# Patient Record
Sex: Male | Born: 1935 | Race: White | Hispanic: No | Marital: Married | Smoking: Never smoker
Health system: Southern US, Community
[De-identification: ages and names within clinical notes are randomized; demographics above are authoritative.]

## PROBLEM LIST (undated history)

## (undated) DIAGNOSIS — M1990 Unspecified osteoarthritis, unspecified site: Secondary | ICD-10-CM

## (undated) DIAGNOSIS — J449 Chronic obstructive pulmonary disease, unspecified: Secondary | ICD-10-CM

## (undated) DIAGNOSIS — I10 Essential (primary) hypertension: Secondary | ICD-10-CM

## (undated) DIAGNOSIS — I639 Cerebral infarction, unspecified: Secondary | ICD-10-CM

## (undated) HISTORY — PX: REPLACEMENT TOTAL KNEE: SUR1224

## (undated) HISTORY — PX: TOTAL HIP ARTHROPLASTY: SHX124

## (undated) HISTORY — PX: INSERT / REPLACE / REMOVE PACEMAKER: SUR710

---

## 1998-05-28 ENCOUNTER — Ambulatory Visit (HOSPITAL_COMMUNITY): Admission: RE | Admit: 1998-05-28 | Discharge: 1998-05-28 | Payer: Self-pay | Admitting: Orthopedic Surgery

## 1999-01-22 ENCOUNTER — Encounter: Payer: Self-pay | Admitting: Neurosurgery

## 1999-01-22 ENCOUNTER — Ambulatory Visit (HOSPITAL_COMMUNITY): Admission: RE | Admit: 1999-01-22 | Discharge: 1999-01-22 | Payer: Self-pay | Admitting: Neurosurgery

## 1999-03-11 ENCOUNTER — Encounter: Payer: Self-pay | Admitting: Neurosurgery

## 1999-03-11 ENCOUNTER — Observation Stay (HOSPITAL_COMMUNITY): Admission: RE | Admit: 1999-03-11 | Discharge: 1999-03-11 | Payer: Self-pay | Admitting: Neurosurgery

## 1999-04-05 ENCOUNTER — Ambulatory Visit (HOSPITAL_COMMUNITY): Admission: RE | Admit: 1999-04-05 | Discharge: 1999-04-05 | Payer: Self-pay | Admitting: Neurosurgery

## 1999-04-05 ENCOUNTER — Encounter: Payer: Self-pay | Admitting: Neurosurgery

## 1999-06-04 ENCOUNTER — Encounter: Admission: RE | Admit: 1999-06-04 | Discharge: 1999-07-13 | Payer: Self-pay | Admitting: Orthopedic Surgery

## 2000-02-22 ENCOUNTER — Ambulatory Visit (HOSPITAL_COMMUNITY): Admission: RE | Admit: 2000-02-22 | Discharge: 2000-02-22 | Payer: Self-pay | Admitting: Gastroenterology

## 2000-03-09 ENCOUNTER — Ambulatory Visit (HOSPITAL_COMMUNITY): Admission: RE | Admit: 2000-03-09 | Discharge: 2000-03-09 | Payer: Self-pay | Admitting: Orthopedic Surgery

## 2003-06-19 ENCOUNTER — Encounter: Admission: RE | Admit: 2003-06-19 | Discharge: 2003-06-19 | Payer: Self-pay | Admitting: Orthopedic Surgery

## 2004-09-03 ENCOUNTER — Encounter: Admission: RE | Admit: 2004-09-03 | Discharge: 2004-09-03 | Payer: Self-pay | Admitting: Gastroenterology

## 2005-06-13 ENCOUNTER — Encounter: Admission: RE | Admit: 2005-06-13 | Discharge: 2005-06-13 | Payer: Self-pay | Admitting: Orthopedic Surgery

## 2006-02-24 ENCOUNTER — Ambulatory Visit (HOSPITAL_COMMUNITY): Admission: RE | Admit: 2006-02-24 | Discharge: 2006-02-25 | Payer: Self-pay | Admitting: Orthopedic Surgery

## 2006-06-02 ENCOUNTER — Encounter: Admission: RE | Admit: 2006-06-02 | Discharge: 2006-06-02 | Payer: Self-pay | Admitting: Gastroenterology

## 2006-10-11 ENCOUNTER — Inpatient Hospital Stay (HOSPITAL_COMMUNITY): Admission: RE | Admit: 2006-10-11 | Discharge: 2006-10-16 | Payer: Self-pay | Admitting: Orthopedic Surgery

## 2007-05-24 ENCOUNTER — Ambulatory Visit (HOSPITAL_COMMUNITY): Admission: RE | Admit: 2007-05-24 | Discharge: 2007-05-25 | Payer: Self-pay | Admitting: Orthopedic Surgery

## 2008-07-28 ENCOUNTER — Inpatient Hospital Stay (HOSPITAL_COMMUNITY): Admission: EM | Admit: 2008-07-28 | Discharge: 2008-07-29 | Payer: Self-pay | Admitting: Emergency Medicine

## 2008-07-28 ENCOUNTER — Ambulatory Visit: Payer: Self-pay | Admitting: Cardiology

## 2009-10-16 ENCOUNTER — Encounter: Admission: RE | Admit: 2009-10-16 | Discharge: 2009-10-16 | Payer: Self-pay | Admitting: Gastroenterology

## 2010-06-22 LAB — CBC
HCT: 37.8 % — ABNORMAL LOW (ref 39.0–52.0)
MCV: 91.5 fL (ref 78.0–100.0)
WBC: 9.1 10*3/uL (ref 4.0–10.5)

## 2010-06-22 LAB — COMPREHENSIVE METABOLIC PANEL
ALT: 17 U/L (ref 0–53)
AST: 21 U/L (ref 0–37)
Alkaline Phosphatase: 61 U/L (ref 39–117)
BUN: 14 mg/dL (ref 6–23)
CO2: 28 mEq/L (ref 19–32)
Creatinine, Ser: 1.07 mg/dL (ref 0.4–1.5)
GFR calc Af Amer: >60 mL/min (ref >60)
GFR calc non Af Amer: >60 mL/min (ref >60)
Potassium: 4.1 mEq/L (ref 3.5–5.1)
Sodium: 136 mEq/L (ref 135–145)

## 2010-06-22 LAB — PROTIME-INR: INR: 1.1 (ref 0.00–1.49)

## 2010-06-22 LAB — CARDIAC PANEL(CRET KIN+CKTOT+MB+TROPI)
CK, MB: 1.1 ng/mL (ref 0.3–4.0)
CK, MB: 1.3 ng/mL (ref 0.3–4.0)
Relative Index: INVALID (ref 0.0–2.5)
Relative Index: INVALID (ref 0.0–2.5)
Total CK: 49 U/L (ref 7–232)
Total CK: 51 U/L (ref 7–232)
Total CK: 64 U/L (ref 7–232)

## 2010-06-22 LAB — CK TOTAL AND CKMB (NOT AT ARMC)
Relative Index: INVALID (ref 0.0–2.5)
Total CK: 66 U/L (ref 7–232)

## 2010-06-22 LAB — POCT I-STAT, CHEM 8
Glucose, Bld: 110 mg/dL — ABNORMAL HIGH (ref 70–99)
Potassium: 3.9 mEq/L (ref 3.5–5.1)
Sodium: 141 mEq/L (ref 135–145)

## 2010-06-22 LAB — HEPARIN LEVEL (UNFRACTIONATED): Heparin Unfractionated: 0.62 IU/mL (ref 0.30–0.70)

## 2010-06-22 LAB — HEMOGLOBIN A1C: Hgb A1c MFr Bld: 5.6 % (ref 4.6–6.1)

## 2010-07-27 NOTE — Cardiovascular Report (Signed)
NAME:  KHOLE, BRANCH NO.:  [id number]   MEDICAL RECORD NO.:  [id number]          PATIENT TYPE:  INP   LOCATION:  2928                         FACILITY:  [facility]   PHYSICIAN:  Rollene Rotunda, MD, FACCDATE OF BIRTH:  09-01-1934   DATE OF PROCEDURE:  DATE OF DISCHARGE:                            CARDIAC CATHETERIZATION   PRIMARY CARE PHYSICIAN:  Isabelle Course.   PROCEDURE:  Left heart catheterization/coronary angiography.   INDICATIONS:  To evaluate the patient with chest pain suggestive of  unstable angina.  He had multiple cardiovascular risk factors.   PROCEDURE NOTE:  Left heart catheterization was performed via the right  femoral artery, the vessel was cannulated using anterior wall puncture.  A #5-French arterial sheath was inserted via the modified Seldinger  technique.  Preformed Judkins and a pigtail catheter were utilized.  The  patient tolerated procedure well and the left the lab in stable  condition.   RESULTS:  Hemodynamics LV 103/16, AO 99/52.   Coronaries:  The left main did not exist over separate ostia.  The LAD  had diffuse luminal irregularities.  There was small with proximal 25%  stenosis.  Circumflex in the proximal AV groove had luminal  irregularities.  There is mid obtuse marginal, which was large with  proximal 25% stenosis.  A second obtuse marginal was small with distal  40% stenosis.  Ramus intermediate was small with ostial 25% stenosis.  The right coronary artery is large and dominant.  There was long ostial  and proximal 30% stenosis.  There was distal diffuse 25-30% stenosis.  The PDA was a large vessel with mid 30% stenosis.   Left ventriculogram.  The left ventriculogram was obtained in the RAO  projection.  The EF was 65% with normal wall motion.   CONCLUSION:  Nonobstructive coronary artery disease.  Normal left  ventricular function.   PLAN:  The patient with primary risk reduction.  He continues to have  discomfort.   He should have a GI evaluation, which could be done as an  outpatient.      Rollene Rotunda, MD, [facility]  Electronically Signed     JH/MEDQ  D:  07/29/2008  T:  07/29/2008  Job:  [id number]   cc:   Isabelle Course

## 2010-07-27 NOTE — H&P (Signed)
NAME:  [name], [name] NO.:  [id number]   MEDICAL RECORD NO.:  [id number]        PATIENT TYPE:  LINP   LOCATION:                               FACILITY:  [facility]   PHYSICIAN:  Georges Lynch. Gioffre, M.D.DATE OF BIRTH:  18-May-1934   DATE OF ADMISSION:  10/11/2006  DATE OF DISCHARGE:                              HISTORY & PHYSICAL   CHIEF COMPLAINT:  Painful left knee.   HISTORY OF PRESENT ILLNESS:  Marcus Mcgee is a 76 year old gentleman here  for his preadmission history and physical.  He has been having problems  with his left knee since a workman's comp injury 18 months ago.  He has  had it evaluated with arthroscopies and other treatments.  He continues  to have pain.  He says it is progressively worsening. He has significant  cartilage damage to the medial and lateral femoral condyles.  The  patient has failed conservative treatment, and at this time, Dr. [name]  and a second opinion.  The next stepped was with a total knee  arthroplasty and the patient would like to proceed.   PAST MEDICAL HISTORY:  1. Severe pain, the left knee.  2. Insomnia.  3. Hypercholesterolemia.  4. Hypertension.  5. GERD.  6. Hiatal hernia.  7. History of bradycardiac episodes with reported normal stress      Doppler and cardiac workup.  8. Hepatitis A history.   DRUG ALLERGIES:  CODEINE.   CURRENT MEDICATIONS:  1. Darvocet one tablet every 4-6 hours p.r.n. pain.  2. Later 100 mg one tablet twice a day.  3. Ambien C R 12.5 mg q.h.s.  4. Enteric-coated aspirin 325 mg a day.  5. Zocor 40 mg a day.  6. Hydrochlorothiazide 12.5 mg a day.  7. Loratadine 10 mg a day.  8. Omeprazole 20 mg a day.  9. Ranitidine 150 mg 2 tablets a day.   REVIEW OF SYSTEMS:  Positive for an RSD-type reaction, postop  superficial pain of the left knee out of portion to the procedure.  History of chronic bronchitis and COPD currently using BiPAP and O2 at  night.  Recent history of significant  bradycardia with normal cardiac workup  with hospitalization through Dr. Vania Rea.  History of hiatal, reflux and hemorrhoids.  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  History of prostate BPH.  History of skin cancer.  Degenerative disk disease of the lumbar spine.  Otherwise, review of  systems categories are negative and unremarkable.   PAST SURGICAL HISTORY:  1. Cervical surgery in 2000.  2. Multiple lumbar surgeries.  3. TURP.  4. Carpal tunnel, bilateral wrists.  5. Right and left knee scopes.   The patient's only problems with anesthesia was during his carpal tunnel  release.  He had difficult time waking up, but other than that, no  complications.   FAMILY MEDICAL HISTORY:  Mother is deceased at the age of 57 due to  heart disease and lung cancer.  Father is deceased at 52 years of age  due to leukemia and heart disease.  He has multiple sisters  and brothers  with diabetes, hypertension and heart disease.   SOCIAL HISTORY:  The patient is married, lives with his wife, worked as  a Naval architect.  No history of smoking or alcohol use.  Has two grown  children, lives in a [place] house.   PHYSICAL EXAMINATION:  VITALS:  Height is 6 feet 2 inches, weight is  212, blood pressure is 122/72, his heart is regular at 78, respirations  12 from he is afebrile.  GENERAL:  This is a healthy-appearing gentleman conscious, alert and  appropriate.  Ambulates with a cane and his right leg, does walk with a  slight left-sided limp.  HEENT: Head is normocephalic.  Pupils equal, round and reactive.  Extraocular movements intact.  Sclerae is not icteric.  Oral buccal  mucosa is pink and moist.  The patient is hard of hearing.  NECK:  Supple.  No palpable lymphadenopathy.  He has a well-healed  anterior cervical incision.  He is able to touch his chin to the chest.  He is able to look up about 45 degrees and rotate right and left 45  degrees with just mild soreness.  CHEST:  Lung  sounds were clear, but distant, and shallow respirations.  No wheezing, rales, rhonchi.  HEART:  Regular rate and rhythm.  Occasional skipped beat.  No murmurs,  rubs or gallops  ABDOMEN:  Soft, nontender.  Bowel sounds present.  No CVA region  tenderness.  EXTREMITIES:  Upper extremities were symmetrically sized and shaped.  He  had good range of motion with shoulders, elbows and wrists.  LOWER EXTREMITIES:  Right and left hip had full extension, flexion up to  130 degrees, 20-30 degrees internal-external rotation with any  discomfort.  Right knee is without any signs of erythema or ecchymosis.  He can fully extend it.  He can flex it back to 120 degrees.  No  instability.  Left knee:  He lacks about 10 degrees of extension.  He  can flex it back to about 80 degrees.  He has pain with this motion.  He  has quite a bit of swelling and superficial soft tissue tenderness  throughout.  The calves are soft and nontender.  His has good motion of  his ankles.  NEUROLOGICAL:  The patient was conscious, alert and appropriate, good  historian.  No gross neurologic defects noted other than the  hypersensitivity around his left knee.  PERIPHERAL VASCULAR:  Carotid pulses were 2+, no bruits, radial pulses  2+, dorsalis pedis pulses were 1+.  He had very trace lower extremity  edema.  BREAST, RECTAL AND GU:  Exams deferred at this time.   IMPRESSION:  1. 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  2. COPD chronic bronchitis with BiPAP use and nasal cannula O2.  3. Recent bradycardic episodes with normal cardiac workup with Dr.      Vania Rea.  4. History of hiatal hernia, reflux disease, hemorrhoids.  5. 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  6. BPH.  7. Degenerative disk disease in the lumbar and cervical region.   PLAN:  The patient has been evaluated by primary care physician, Dr.  Pablo Lawrence at [facility] for his  upcoming surgical  procedure.  The patient was recently has significant cardiac workup  including hospitalization, stress test, Doppler and Holter monitor for  bradycardic issues.  The patient indicates that he has a normal heart  which appears to  be healthy and unexplained and occasional bradycardia.  We will contact Dr. Colin Rhein office and get this information provided  to use and forward it to the hospital prior to surgery.  Other than  that, the patient will undergo all routine labs and tests prior to  having a left total knee arthroplasty by Dr. [name] on October 11, 2006.  I did have a very frank discussion with the patient about the total knee  arthroplasty and the pain issues and heart work related to physical  therapy, and our concerns were the pain he had just after arthroscopy.  The patient is aware of this and would still like to proceed with a left  total knee arthroplasty by Dr. [name].      Jamelle Rushing, P.A.    ______________________________  Georges Lynch Marcus Mcgee, M.D.    RWK/MEDQ  D:  09/28/2006  T:  09/29/2006  Job:  [id number]   cc:   Windy Fast A. Marcus Mcgee, M.D.  Fax: [phone]

## 2010-07-27 NOTE — Discharge Summary (Signed)
NAME:  Marcus Mcgee, Marcus Mcgee NO.:  [id number]   MEDICAL RECORD NO.:  [id number]          PATIENT TYPE:  INP   LOCATION:  2928                         FACILITY:  [facility]   PHYSICIAN:  Rollene Rotunda, MD, FACCDATE OF BIRTH:  1934-11-18   DATE OF ADMISSION:  07/28/2008  DATE OF DISCHARGE:  07/29/2008                               DISCHARGE SUMMARY   PRIMARY CARDIOLOGIST:  None.   PRIMARY CARE PHYSICIAN:  Dr. Isabelle Course (at [facility]).   DISCHARGE DIAGNOSIS:  Noncardiac chest pain.   SECONDARY DIAGNOSES:  1. Nonobstructive coronary artery disease, status post cardiac      catheterization on Jul 29, 2008.  2. Hyperlipidemia.  3. Benign prostatic hypertrophy.  4. Degenerative joint disease.  5. Gastroesophageal reflux disease (with hiatal hernia).  6. Hepatitis A.  7. Chronic obstructive pulmonary disease.  8. Obstructive sleep apnea (on bilevel positive airway pressure).   PAST SURGICAL HISTORY:  1. S/P bilateral knee surgery.  2. S/P back surgery.  3. S/P C-spine surgery.  4. S/P bilateral wrist surgery.  5. S/P right shoulder surgery.  6. S/P total left knee arthroplasty.   ALLERGIES/INTOLERANCES:  Intolerant to CODEINE and VIOXX.   PROCEDURES PERFORMED DURING THIS HOSPITALIZATION:  The patient had EKG  performed on Jul 28, 2008, showing sinus bradycardia at a rate of 55 bpm  with no acute ST-T wave changes and no significant Q-waves; however,  left axis deviation and possible left anterior fascicular block and no  evidence of hypertrophy.  PR 180, QRS 98, and QTc 409.  The patient had  chest x-ray completed on Jul 28, 2008, that showed no acute  cardiopulmonary disease and mild left base scar or atelectasis.  The  patient had cardiac catheterization on Jul 29, 2008, showing  nonobstructive coronary artery disease and normal LV function with LVEF  estimated at 65%.   HISTORY OF PRESENT ILLNESS:  Mr. Gair is a 76 year old male with no  prior cardiac  history.  He did have a workup in 1980 for chest  discomfort, but no etiology was identified.  There was an apparent  stress test at another facility around 2008 by the patient's report.  He  thinks it was normal.  This was done in part because he has a resting  heart rate at times in 30's at night.   Yesterday, he was working, doing remodeling in a house which he has been  doing for a while.  He noticed while going up and down stairs, he was  more dyspneic and actually mentioned this to his wife.  However, he  denies chest discomfort.  Otherwise, he has been able to do his regular  walking and activities without bringing on any symptoms.  Last night  (Jul 27, 2008), he was trying to sleep and noticed left-sided chest  discomfort.  It was a 6/10 at its peak and did not radiate.  He denies  any previous symptoms similar to this.  He felt like he could not take a  deep breath at that time.  However, he denies worsening with position or  deep inhalation.  Pain waxed and waned slightly in intensity throughout  the evening.  The pain is reported as sharp in quality.  He presented to  the emergency department where he had no acute ST-T wave changes.  However, he continued to report chest discomfort.  So, at the time of  his H&P, no improvement with sublingual nitroglycerin.  He also was  given aspirin and then IV nitroglycerin.   HOSPITAL COURSE:  The patient admitted and underwent procedures as  described above.  He tolerated them well without any significant  complications.  The patient's cardiac enzymes remained negative during  his hospital course; however, due to his advanced age and compound  symptomology, he underwent cardiac catheterization (see results above).  Vital signs remained stable during the patient's brief hospital course.  Most recent vital signs on date of discharge were temperature 98 degrees  Fahrenheit, BP 103/57, pulse 52, respiration rate 18, O2 saturation 99%  on  room air.  Just prior to discharge, the patient received consultation  by care coordinator and case management consult placed for home O2.  Otherwise, the patient will follow up with primary care Myrissa Chipley who  will determine whether or not a GI consult is required.  No further  cardiac workup is advised at this time.  The patient will use his old  medication list with no changes, followup instructions, and post cath  instructions at the time of discharge.  All questions and concerns will  be addressed at that time.   DISCHARGE MEDICATIONS:  1. Trazodone 50 mg p.o. at bedtime.  2. Temazepam 15 mg p.o. at bedtime.  3. Flomax 0.4 mg p.o. daily.  4. HCTZ/lisinopril 12.5/20 mg p.o. daily.  5. Lyrica 150 mg p.o. b.i.d.  6. Omeprazole 20 mg p.o. b.i.d.  7. Zocor 10 mg p.o. daily.  8. Gemfibrozil 600 mg p.o. daily.  9. Loratadine 10 mg p.o. daily.  10.Enteric-coated aspirin 81 mg p.o. daily.  11.Vitamin B complex.  12.The vitamin D.   Duration of discharge encounter is 45 minutes including physician time.       Jarrett Ables, [facility]      Rollene Rotunda, MD, [facility]  Electronically Signed    MS/MEDQ  D:  07/29/2008  T:  07/30/2008  Job:  [id number]   cc:   Isabelle Course

## 2010-07-27 NOTE — Op Note (Signed)
NAME:  Marcus Mcgee, Marcus Mcgee NO.:  [id number]   MEDICAL RECORD NO.:  [id number]          PATIENT TYPE:  AMB   LOCATION:  SDS                          FACILITY:  [facility]   PHYSICIAN:  Vania Rea. Supple, M.D.  DATE OF BIRTH:  1935/01/06   DATE OF PROCEDURE:  05/24/2007  DATE OF DISCHARGE:                               OPERATIVE REPORT   PREOPERATIVE DIAGNOSIS:  1. Chronic right shoulder impingement syndrome.  2. Right shoulder symptomatic acromioclavicular joint arthrosis.   POSTOPERATIVE DIAGNOSIS:  1. Chronic right shoulder impingement syndrome.  2. Right shoulder symptomatic acromioclavicular joint arthrosis.  3. Diffuse chondromalacia of the glenoid.  4. Complex and extensive circumferential degenerative labral tear.  5. Multiple intra-articular chondral loose bodies.   PROCEDURE:  1. Right shoulder examination under anesthesia.  2. Right shoulder diagnostic arthroscopy.  3. Debridement of complex and extensive degenerative circumferential      labral tear.  4. Chondroplasty of the glenoid.  5. Removal of multiple chondral loose bodies.  6. Arthroscopic subacromial decompression bursectomy.  7. Arthroscopic distal clavicle resection.   SURGEON:  Vania Rea. Supple, M.D.   Threasa HeadsFrench Ana A. Shuford, P.A.-C.   ANESTHESIA:  General endotracheal as well as a preop interscalene block.   ESTIMATED BLOOD LOSS:  Minimal.   DRAINS:  None.   HISTORY:  Marcus Mcgee is a 76 year old gentleman who has had chronic right  shoulder pain with progressively increasing functional limitations and  symptoms that have been refractory to prolonged attempts at conservative  management.  Due to his ongoing pain and functional limitations, he is  brought to the operating room at this time for planned right shoulder  arthroscopy as described below.  I preoperatively counseled Marcus Mcgee on  treatment options as well as risks versus benefits thereof.  The  possible surgical complications of  bleeding, infection, neurovascular  injury, persistent pain, loss of motion, anesthetic complications,  possible need for additional surgery are reviewed.  He understands,  accepts, and agrees with our planned procedure.   PROCEDURE IN DETAIL:  After undergoing routine preop evaluation, the  patient received prophylactic antibiotics.  Interscalene block was  established in the preop holding area by the anesthesia department.  He  was placed supine on the table and underwent smooth induction of a  general endotracheal anesthesia.  He was turned to the left lateral  decubitus position on the beanbag and appropriately padded and  protected.  Right shoulder examination under anesthesia revealed full  passive motion.  There were no obvious instability patterns.  The right  arm suspended at 70 degrees of abduction with 10 pounds of traction.  The right shoulder girdle region was then sterilely prepped and draped  in a standard fashion.   A posterior portal was established in the glenohumeral joint and  anterior portal established under direct visualization.  There was an  obvious extensive degenerative tear of the labrum anteriorly,  superiorly, posteriorly and inferiorly.  All aspects of the labrum were  debrided to a stable base with a shaver.  There was also diffuse grade 2  and 3 chondromalacia across the glenoid face and these areas were all  debrided with the shaver into a stable cartilaginous base.  The humeral  head appeared to be in good condition.  The rotator cuff, while showing  some evidence for diffuse degeneration, there was no obvious tearing.  There were no obvious instability patterns.  There were a number of  cartilaginous loose bodies that were all evacuated with the shaver.  At  this point, final inspection and irrigation was then completed.  Fluid  and instruments were removed from the glenohumeral joint.  The arm was  dropped down to about 30 degrees of abduction with  the arthroscope  introduced in the subacromial space through a posterior portal and a  direct lateral portal established in the subacromial space.  Abundant  bursal tissue was encountered and this was debrided with a combination  of the shaver and the Arthrex wand.  The wand was used to remove  periosteum from the under surface of the anterior half of the acromion.  There was an anterior inferior acromial hook.  A bur was introduced  and used to perform subacromial decompression creating a type 1  morphology.  The bursal surface of the rotator cuff was carefully  inspected and probed and found to be intact although there was some  generalized fraying and thinning.  There were certainly no full  thickness defects.  There was no significant attenuation to suggest any  need for rotator cuff repair.  There was a very prominent spur emanating  from the anterior aspect of the distal clavicle and marked degeneration  of the capsular tissues at the [facility] joint.  A portal was then established  anterior to the distal clavicle and distal clavicle resection performed  with a bur.  Care was taken to assure the entire circumference of the  distal clavicle could be visualized to ensure adequate removal of bone.  We then completed a subacromial/subdeltoid bursectomy.  Approximately 8  mm of bone was resected from the distal clavicle.  At this time, final  hemostasis was obtained.  Fluid and instruments were removed.  The  portals were closed with Monocryl and Steri-Strips.  A bulky dry  dressing was taped to the right shoulder and the right arm was placed a  sling.  The patient was placed supine, extubated, and taken to the  recovery room in stable condition.      Vania Rea. Supple, M.D.  Electronically Signed     KMS/MEDQ  D:  05/24/2007  T:  05/24/2007  Job:  [id number]

## 2010-07-27 NOTE — H&P (Signed)
NAME:  Marcus Mcgee, Marcus Mcgee NO.:  [id number]   MEDICAL RECORD NO.:  [id number]          PATIENT TYPE:  INP   LOCATION:  1823                         FACILITY:  [facility]   PHYSICIAN:  Rollene Rotunda, MD, FACCDATE OF BIRTH:  08/14/1934   DATE OF ADMISSION:  07/28/2008  DATE OF DISCHARGE:                              HISTORY & PHYSICAL   PRIMARY CARE PHYSICIAN:  Dr. Pablo Lawrence, [facility].   CARDIOLOGIST:  None.   REASON FOR PRESENTATION:  Evaluate patient with chest pain.   HISTORY OF PRESENT ILLNESS:  The patient is a pleasant 76 year old  gentleman who has no prior cardiac history.  He did have a workup in  1980 for chest discomfort, but no etiology was identified.  There was  apparent stress test at another facility around 2008 by the patient's  report.  He thinks that that was normal.  This was done in part because  he has a resting heart rate at times in the 30s at night.   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  He did notice it going up and down the stairs.  He  was more dyspneic, and actually mentioned this to his wife.  However, he  did not have chest discomfort.  He has, otherwise, been able to do  walking and activity without bringing on any of these symptoms.  Last  night he was trying to sleep.  He had discomfort left-sided under his  left breast that woke him.  It was 6/10 at its peak.  It did not radiate  to his arms or to his neck.  He did not remember having this in the  past.  He did not take anything to try to improve it.  He felt like he  could not get a deep breath.  Did not, however, get worse with deep  breathing or with position.  It would wax and wane slightly in  intensity.  It was a sharper pain rather than a dull pain.  He finally  presented to the emergency room where he has had no acute ST-segment  changes.  However, he continues to have this discomfort.  So far there  has been no  improvement with sublingual nitroglycerin.  He has also had  aspirin.  He is now being started on IV nitroglycerin.  First point of  care markers were negative.   PAST MEDICAL HISTORY:  1. Hyperlipidemia.  2. Benign prostatic hypertrophy.  3. Degenerative joint disease.  4. Gastroesophageal reflux disease/hiatal hernia.  5. Hepatitis A.  6. COPD/obstructive sleep apnea (on BiPAP).   PAST SURGICAL HISTORY:  1. Bilateral knee surgery.  2. Back surgery.  3. C-spine surgery.  4. Bilateral wrist surgery.  5. Right shoulder surgery.  6. Left total knee replacement.   ALLERGIES:  Intolerance to CODEINE.   MEDICATIONS:  1. Trazodone.  2. Temazepam.  3. Flomax.  4. Lisinopril HCT 20/12.5.  5. Lyrica 150 b.i.d.  6. Omeprazole 20 mg b.i.d.  7. Zocor 10 mg daily.  8. Lopid 600 mg  daily.   SOCIAL HISTORY:  The patient lives in [place] with his wife.  He is a  [employer]al [employer].  He has never smoked cigarettes.  He does not drink  alcohol.   FAMILY HISTORY:  Remarkable for his father having his first myocardial  infarction in his 34s.  He died at 14 of complications of coronary  disease.  He had a brother who had a massive myocardial infarction at  53.   REVIEW OF SYSTEMS:  As stated in the HPI, and, otherwise, positive for  decreased hearing, arthralgias, insomnia, knee pain.  Negative for all  other systems.   PHYSICAL EXAMINATION:  The patient is pleasant and in no distress.  Blood pressure 110/55, heart rate 55 and regular, respiratory rate 16,  afebrile.  HEENT:  Eyelids unremarkable, pupils equal, round, and reactive to  light, fundi not visualized, oral mucosa unremarkable.  NECK:  No jugular distention at 45 degrees, carotid upstrokes brisk and  symmetric, no bruits, no thyromegaly.  LYMPHATICS:  No cervical, axillary or inguinal adenopathy.  LUNGS:  Clear to auscultation bilaterally.  BACK:  No costovertebral angle tenderness.  CHEST:  Unremarkable.  HEART:  PMI not  displaced or sustained, S1 and S2 within normal limits.  No S3, no S4, no clicks, no rubs, no murmurs.  ABDOMEN:  Mildly obese, positive bowel sounds normal in frequency and  pitch.  No bruits, no rebound, no guarding, no midline pulsatile mass.  No hepatomegaly, no splenomegaly.  SKIN:  No rashes, no nodules.  EXTREMITIES:  2+ pulses throughout.  No edema, no cyanosis, no clubbing.  NEUROLOGICAL:  Oriented to person, place, time.  Cranial nerves II-XII  grossly intact, motor grossly intact.   EKG:  Sinus rhythm, rate at 52, left axis deviation, left anterior  fascicular block, nonspecific inferior T-wave flattening, possible old  anteroseptal infarct.   LABORATORIES:  WBC 13.9, hemoglobin 41.  Sodium 141, potassium 3.9, BUN  18, creatinine 1.2.  Chest x-ray:  No acute disease, mild left basilar  scar.   ASSESSMENT AND PLAN:  1. Chest pain.  The patient's chest pain is worrisome for unstable      angina.  He has had no improvement with sublingual nitroglycerin.      He has a strong family history of obstructive coronary disease.  He      does have borderline hypertension and does have dyslipidemia.  He      was complaining of dyspnea yesterday with exertion.  Given this, I      think the pretest probability of obstructive coronary disease is      somewhat high.  Therefore, I would not suggest stress perfusion      imaging, but would rather suggest cardiac catheterization.  I      discussed this risk at length with the patient and his wife and      family.  They understand the procedure, and would agree to proceed.      For now, we are going to use aspirin and heparin.  Of course, we      cannot use a beta blocker with his bradycardia.  We will use      nitroglycerin for pain relief.  We will use other medications as      needed such as morphine.  He will be taken electively to the      catheterization lab, but urgently if he has any increasing      discomfort or objective signs of  ischemia.  2. Bradycardia.  This seems to be at baseline.  He has not had any      presyncope or syncope.  This can be observed avoiding all negative      chronootropes.  3. Risk reduction.  Will check a lipid profile.  4. Hypertension.  He says this is borderline.  We may have to hold his      lisinopril HCT while we are using nitrates.  5. Joint pain.  He will continue his previous medical regimen.      Rollene Rotunda, MD, [facility]  Electronically Signed     JH/MEDQ  D:  07/28/2008  T:  07/28/2008  Job:  [id number]

## 2010-07-27 NOTE — Op Note (Signed)
NAME:  Marcus Mcgee, ZAHLER NO.:  [id number]   MEDICAL RECORD NO.:  [id number]          PATIENT TYPE:  INP   LOCATION:  0010                         FACILITY:  [facility]   PHYSICIAN:  Georges Lynch. Gioffre, M.D.DATE OF BIRTH:  [birth year]   DATE OF PROCEDURE:  10/11/2006  DATE OF DISCHARGE:                               OPERATIVE REPORT   SURGEON:  Dr. Darrelyn Hillock.   ASSISTANT:  Arlyn Leak, PA.   PREOP DIAGNOSIS:  Severe traumatic degenerative type, arthritis left  knee.   POSTOPERATIVE DIAGNOSIS:  Severe traumatic degenerative type, arthritis  left knee.   OPERATION:  Left total knee arthroplasty utilizing the DePuy system.  I  utilized a rotating platform, size 5 10 mm thickness insert.  The tibial  tray was a size 5, the femur was a size 5 left posterior cruciate  sacrificing type femoral component.   PROCEDURE:  Under general anesthesia routine orthopedic prep and drape  of the left lower extremity was carried out.  The leg was exsanguinated  with an Esmarch.  Tourniquet was elevated at 350 mmHg.  Total tourniquet  time was about 1 hour.  At this time an incision was made over the  anterior aspect of the left knee.  Bleeders were identified and  cauterized.  I then created a median parapatellar incision. The patella  was reflected laterally.  The knee was flexed and I then did medial and  lateral meniscectomies and excised the anterior and posterior cruciate  ligaments.  Of note he had significant arthritic changes in his knee.   We made an initial drill hole in the intercondylar notch.  A guide rod  was inserted up into the femoral canal and then we removed 10 mm  thickness off the distal femur.  The #2 jig was inserted to do our  appropriate measurements and we elected to use a size 5 left femur.  We  then inserted our #3 jig and carried out anterior and posterior chamfer  cuts for the distal femur.  Following that we went down and prepared the  tibia for a size 5  tibia.  We removed 4 mm thickness off the affected  medial side of the tibia.  We measured the tibia to be a size 5 tray.   Following that we cut our keel cut out of the tibial plateau.  After the  tibia was prepared we then searched for posterior osteophytes, cleaned  out the knee posteriorly as well.  We then cut our notch cut out of the  distal femur in the usual fashion and then following that, inserted our  trial components went through range of motion and selected a 10-mm  thickness tibial insert.  We then cut our patella.  We did a resurfacing  patella in the usual fashion.  The appropriate measurements for the  patella were taken.  We then made three drill holes in the patella for a  size 41 patellar component.   Following that we then removed all three components, water picked out  the knee, inserted Gelfoam in the usual fashion in  the femoral canal and  the tibial canal.  We then made sure the bone ends were dried and then  we inserted our cement and cemented all three components in  simultaneously.  We then went through and examined the knee and made  sure there were no other loose pieces of cement.  We then injected 20 mL  of 0.25% Marcaine with epinephrine and Toradol in the soft tissue.  We  then inserted FloSeal 10 mL, let the tourniquet down, held pressure on  the leg for a few minutes and then flexed the knee up and made sure the  tibial side was clean and we then  inserted our permanent size 5 10 mm thickness rotating platform, reduced  the knee and had excellent function.  We then reduced the knee and then  inserted a Hemovac drain.  I closed the knee in layers in the usual  fashion.  Sterile Neosporin dressing was applied.  The patient had 2  grams of IV Ancef preop.           ______________________________  Georges Lynch. Darrelyn Hillock, M.D.     RAG/MEDQ  D:  10/11/2006  T:  10/12/2006  Job:  [id number]   cc:   Windy Fast A. Darrelyn Hillock, M.D.  Fax: [phone]

## 2010-07-30 NOTE — Op Note (Signed)
[facility]  Patient:    Marcus Mcgee, Marcus Mcgee                            MRN: [id number] Proc. Date: 03/09/00 Attending:  Fayrene Fearing P. Aplington, M.D.                           Operative Report  PREOPERATIVE DIAGNOSIS:  Torn medial meniscus, right knee.  POSTOPERATIVE DIAGNOSES: 1. Minor tears medial and lateral menisci. 2. 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  OPERATION PERFORMED:  Right knee arthroscopy with 1) shaving of medial and lateral menisci, 2) shaving of medial and lateral femoral condyle.  SURGEON:  Dr. Simonne Come.  ASSISTANT:  Nurse.  ANESTHESIA:  General.  PATHOLOGY AND JUSTIFICATION FOR PROCEDURE:  He has had 2 prior right knee surgeries, the first being and arthrotomy in 1996 with debridement of the knee and arthroscopy in the early 1980s. Recently he has had progressive pain in the inner aspect of the knee associated with giving way causing him to fall and feeling that it wants to catch. MRI demonstrate a suspected medial meniscus tear. For this reason, he is here today for the above mentioned arthroscopy. See operative description below for additional details and pathology.  DESCRIPTION OF PROCEDURE:  Satisfactory general anesthesia, pneumatic tourniquet, thigh stabilizer, the right knee was prepped with Duraprep, draped in a sterile field. Superior medial saline inflow pushed through an anterior medial portal. The lateral compartment of the knee joint was evaluated. He had a moderate amount of fibrous tissue and synovium which I had to resect for visualization purposes. There was a little flap deformity of the anterior third of the lateral meniscus which I shaved down until. The inner border and the medial third also was irregular and I shaved this down until smooth as well. There was a defect noted in the mid portion of the weightbearing area of the lateral femoral condyle which I debrided down gently  with a 3.5 shaver. The remainder of the knee joint laterally looked quite good. The ACL looked intact. Looking up in the lateral gutter and suprapatellar area, no abnormalities were noted. I then reversed portals. Had a moderate amount of synovitis and fibrosis there as well which I resected. He had deformity of the anterior third of the medial meniscus which I shaved down until smooth. He also had a defect on the weightbearing surface of the medial femoral condyle ______ to the intercondylar area which I shaved down until smooth as well. The posterior horn was well visualized and had no deformity noted. On looking up in the medial gutter and suprapatellar area, no other additional abnormalities were noted. The knee joint was then irrigated until clear and all fluid possible removed. The 2 anterior portals were closed with 4-0 nylon, 20 cc of 0.5% Marcaine with adrenaline and 4 mg of morphine were then instilled through the inflap rasp which was removed and this portal closed with 4-0 nylon as well. Betadine Adaptic dry sterile dressing were applied. The tourniquet was released. The patient tolerated the procedure well and at the time of this dictation was on his way to the recovery room in satisfactory condition with no known complications. DD:  03/09/00 TD:  03/09/00 Job: [id number]

## 2010-07-30 NOTE — Discharge Summary (Signed)
NAMEMarland Kitchen  Mcgee, Marcus                  ACCOUNT NO.:  [id number]   MEDICAL RECORD NO.:  [id number]          PATIENT TYPE:  INP   LOCATION:  1607                         [facility]ACILITY:  [facility]   PHYSICIAN:  Georges Lynch. Gioffre, M.D.DATE OF BIRTH:  [birth year]   DATE OF ADMISSION:  10/11/2006  DATE OF DISCHARGE:  10/16/2006                               DISCHARGE SUMMARY   ADMISSION DIAGNOSES:  1. 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  2. History of chronic obstructive pulmonary disease with the use of      biphasic positive airway pressure machine.  3. Bradycardic episodes with normal cardiac workup.  4. History of hiatal hernia.  5. Reflux disease.  6. History of hemorrhoids.  7. History of hepatitis A.  8. Benign prostatic hypertrophy.  9. Degenerative disk disease in the lumbar cervical region.   DISCHARGE DIAGNOSES:  1. Left total knee arthroplasty.  2. Asymptomatic postoperative blood loss anemia.  3. Postoperative temperatures, etiology unknown.  4. History of chronic obstructive pulmonary disease with biphasic      positive airway pressure machine use with postoperative small      pleural effusion.  5. History of bradycardic events with normal cardiac workup.  6. History of hiatal hernia.  7. History of reflux disease.  8. History of hemorrhoids.  9. History of hepatitis A.  10.History of benign prostatic hypertrophy.  11.History of cervical and lumbar degenerative disk disease.   HISTORY OF PRESENT ILLNESS:  The patient is a 76 year old gentleman with  a Worker's Compensation injury to his left knee.  He had small areas of  traumatic arthritis and continued to have severe pain with failed  conservative treatment.  The patient elects to go ahead with a total  knee arthroplasty.   ALLERGIES:  CODEINE.   MEDICATIONS ON ADMISSION:  1. Darvocet 1 tablet every 4-6 hours.  2. Ambien CR 12.5 mg nightly.  3. Enteric-coated aspirin.  4. Zocor 40  mg a day.  5. Hydrochlorothiazide 12.5 mg a day.  6. Loratadine 10 mg a day.  7. Omeprazole 20 mg a day.  8. Ranitidine 150 mg 2 tablets a day.   PROCEDURE:  On October 11, 2006, the patient underwent a left total knee  arthroplasty by Dr. Darrelyn Hillock, assisted by Oneida Alar, PA-C, under general  anesthesia.  There were no complications.  Minimal blood loss.  The  patient was transferred to the recovery room and then to orthopedic  floor in good condition.  The patient had the following components  implanted:  A size 5 left femoral component, a size 5 cemented keel  tibial tray, a size 5-10 mm polyethylene bearing, size 41 mm, three-peg  patella.  All components were implanted with polymethyl methacrylate and  vancomycin mixed in.   CONSULTATIONS:  The following routine consults were requested:  physical  therapy, case management, pharmacy for Coumadin dosing.   HOSPITAL COURSE:  On October 11, 2006, the patient was taken to the OR by  Dr. Ranee Gosselin.  The patient had a left total knee  arthroplasty  performed without any complications.  The patient was transferred to the  recovery room and then to the orthopedic floor in good condition.  The  patient was on IV antibiotics, pain medicines and Coumadin and heparin  for DVT prophylaxis.  The patient then underwent a 4-day postoperative  course in which the patient did develop some postoperative temperatures.  It was evaluated with chest x-ray.  No signs of a pneumonia, but he did  have a small pleural effusion on the left.  The patient had a history of  COPD.  The patient was encouraged to use incentive spirometer, deep  breathing and coughing.  The patient's wound had a significant amount of  bruising.  Subcutaneously within the joint, he developed some  significant pressure blisters throughout the left knee which has  probably contributed to his postoperative temperatures.  He was placed  on Keflex prophylactically due to the blisters.  The  patient used BiPAP  machine during this hospitalization.  The patient worked with physical  therapy on a daily basis without any issues.  He was able to transition  from IV pain medicines and to p.o. medications while without any issues.  It was felt on postop day #4, he was orthopedically ready for discharge  home to outpatient home health physical therapy.  Arrangements were made  and he was discharged in good condition.   LABORATORY DATA AND X-RAY FINDINGS:  CBC on admission found WBC 7.2,  hemoglobin 14.6, platelets 269.  On August 2, his white count went up to  14.  On discharge, his CBC was 11.1, hemoglobin 10, hematocrit 29,  platelets 172.  He was on Keflex for significant pressure blisters  around the incision.  He had no focal signs of infection at the knee or  pneumonia.  The patient's INR was 2.4 with pharmacy adjusting his  Coumadin.  Routine chemistries on August 1, found sodium 135, potassium  of 3.5, glucose 153, BUN 10, creatinine 0.90 with no adjustments of  medications.  EKG on admission showed marked sinus bradycardia at 39  beats per minute.  Chest x-ray on August 2, found new left pleural  effusion.  No evidence of pneumonia.   DIET:  No restrictions.   ACTIVITY:  The patient is to increase activity as tolerated.  Weightbearing as tolerated with the use of crutches.   WOUND CARE:  The patient is to change his dressing daily and keep wound  clean.   FOLLOW UP:  The patient needs a followup appointment with Dr. Darrelyn Hillock in  1 week from discharge, [phone], for that appointment.   DISCHARGE MEDICATIONS:  1. Mepergan fortis 1-2 tablets every 4-6 hours for pain, if needed.  2. Robaxin 500 mg 1 tablet every 6 hours for pain as needed.  3. Coumadin 5 mg once a day unless changed by home health pharmacy.  4. Keflex 500 mg p.o. four times a day.  5. Ambien CR 12.5 mg p.o. nightly.  6. Zocor 40 mg a day.  7. Hydrochlorothiazide 12.5 mg a day.  8. Loratadine 10 mg a  day.  9. Omeprazole 20 mg a day.  10.Ranitidine 150 mg 2 tablets a day.   CONDITION ON DISCHARGE:  The patient's condition upon discharge to home  is listed as improved and good.      Jamelle Rushing, P.A.    ______________________________  Georges Lynch Darrelyn Hillock, M.D.    RWK/MEDQ  D:  10/25/2006  T:  10/26/2006  Job:  [id number]

## 2010-07-30 NOTE — Procedures (Signed)
[facility]  Patient:    Marcus Mcgee, Marcus Mcgee                         MRN: [id number] Adm. Date:  [id number] Attending:  Orland Mustard CC:         Anderson Malta, M.D., [address] [place], [place]  [address]   Procedure Report  DATE OF BIRTH:  December 24, 1934.  PROCEDURE:  Colonoscopy.  ENDOSCOPIST:  Llana Aliment. Edwards, M.D.  MEDICATIONS:  Fentanyl 62.5 mcg, Versed 7 mg IV.  SCOPE:  Pediatric videocolonoscope.  INDICATION:  New onset of rectal bleeding in a 76 year old gentleman.  DESCRIPTION OF PROCEDURE:  Procedure had been explained to the patient and consent obtained.  With the patient in the left lateral decubitus position, the pediatric videocolonoscope was inserted and advanced under direct visualization.  Prep was excellent.  We were able to advance to the cecum without difficulty.  Ileocecal valve and appendiceal orifice were identified. Scope was withdrawn; cecum, ascending colon, hepatic flexure, transverse colon, splenic flexure, descending and sigmoid colon seen well upon removal. Moderate diverticulosis was seen in the sigmoid colon and internal hemorrhoids seen in the rectum.  Scope was withdrawn.  Patient tolerated procedure well.  ASSESSMENT 1. Internal hemorrhoids -- probable source of bleeding. 2. Moderate diverticulosis.  PLAN 1. Will plan on giving a hemorrhoid sheet and diverticular sheet. 2. I will see back on an as-needed basis. DD:  02/22/00 TD:  02/22/00 Job: [id number]

## 2010-07-30 NOTE — Op Note (Signed)
NAME:  Marcus Mcgee, Marcus Mcgee                  ACCOUNT NO.:  [id number]   MEDICAL RECORD NO.:  [id number]          PATIENT TYPE:  AMB   LOCATION:  DAY                          FACILITY:  [facility]   PHYSICIAN:  Georges Lynch. Gioffre, M.D.DATE OF BIRTH:  1934-05-05   DATE OF PROCEDURE:  02/23/2006  DATE OF DISCHARGE:                               OPERATIVE REPORT   SURGEON:  Georges Lynch. Darrelyn Hillock, M.D.   ASSISTANT:  Nurse.   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   OPERATION:  1. Diagnostic arthroscopy, left knee.  2. Abrasion chondroplasty to the medial femoral condyle, left knee.  3. Abrasion chondroplasty of the lateral femoral condyle, left knee.  4. Synovectomy, suprapatellar pouch.  5. Synovectomy of the lateral joint.   PROCEDURE:  Under general anesthesia, routine orthopedic prep and  draping of the left lower extremity was carried out.  He had 1 g of IV  Ancef.  A small punctate incision made, suprapatellar pouch, inflow  cannula was inserted and the knee was distended with saline.  At this  time, another small punctate incision made in the anterolateral joint  and a complete diagnostic arthroscopy carried out.  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  I introduced a  shaver suction device and did synovectomy as well as utilizing the  ArthroCare.  Following that I examined the patella.  He had some  traumatic cartilaginous defects of the patella as well.  I went down and  looked at his cruciates.  The cruciates were fine.  The lateral joint  showed synovitis in the lateral joint.  I introduced the shaver suction,  device did a synovectomy.  Following that I went in the lateral joint  space.  I did an abrasion chondroplasty.  He had a traumatic chondral  defect of the lateral femoral condyle.  He had exact same thing in the  medial joint, both lateral medial menisci were intact but he had a  traumatic chondral defect with a large flap of cartilage literally  hanging down from the bone.  I introduced the shaver suction device, did  an abrasion chondroplasty.  I probed the menisci.  The menisci were  intact.  There were no other abnormalities.  I thoroughly irrigated out  the knee, closed all three punctate incisions with 3-0 nylon suture.  I  injected 30 mL of 0.5% Marcaine with epinephrine into the knee joint,  and a sterile Neosporin dressing was applied.  The patient had 1 g of IV  Ancef preop.           ______________________________  Georges Lynch. Darrelyn Hillock, M.D.    RAG/MEDQ  D:  02/24/2006  T:  02/24/2006  Job:  [id number]

## 2010-12-06 LAB — CBC
HCT: 46.2
Hemoglobin: 15.7
MCV: 89
RBC: 5.19
RDW: 13.5

## 2010-12-06 LAB — COMPREHENSIVE METABOLIC PANEL
CO2: 30
Calcium: 9.6
Creatinine, Ser: 0.94
GFR calc Af Amer: >60
GFR calc non Af Amer: >60
Glucose, Bld: 83
Total Protein: 7.1

## 2010-12-06 LAB — URINALYSIS, ROUTINE W REFLEX MICROSCOPIC
Nitrite: NEGATIVE
Specific Gravity, Urine: 1.018
Urobilinogen, UA: 1
pH: 7.5

## 2010-12-06 LAB — BASIC METABOLIC PANEL
Calcium: 9.6
Chloride: 103
Creatinine, Ser: 0.92

## 2010-12-06 LAB — PROTIME-INR: INR: 0.9

## 2010-12-06 LAB — BILIRUBIN, DIRECT: Bilirubin, Direct: <0.1

## 2010-12-27 LAB — HEMOGLOBIN AND HEMATOCRIT, BLOOD
HCT: 40.7
Hemoglobin: 14.1

## 2010-12-27 LAB — CBC
HCT: 32.1 — ABNORMAL LOW
Hemoglobin: 10 — ABNORMAL LOW
Hemoglobin: 11.5 — ABNORMAL LOW
Hemoglobin: 12.3 — ABNORMAL LOW
MCHC: 34.8
MCV: 88.7
MCV: 88.3
Platelets: 155
RBC: 3.27 — ABNORMAL LOW
RBC: 3.68 — ABNORMAL LOW
RBC: 3.76 — ABNORMAL LOW
RBC: 3.99 — ABNORMAL LOW
RBC: 4.78
RDW: 12.8
RDW: 13.1
WBC: 11.1 — ABNORMAL HIGH
WBC: 13.4 — ABNORMAL HIGH
WBC: 14.5 — ABNORMAL HIGH
WBC: 7.2

## 2010-12-27 LAB — COMPREHENSIVE METABOLIC PANEL
ALT: 17
AST: 17
CO2: 33 — ABNORMAL HIGH
Chloride: 105
GFR calc Af Amer: >60
GFR calc non Af Amer: >60
Potassium: 4.8
Sodium: 144
Total Bilirubin: 0.7

## 2010-12-27 LAB — BASIC METABOLIC PANEL
CO2: 32
Calcium: 8.4
Chloride: 103
GFR calc Af Amer: >60
GFR calc non Af Amer: >60
Glucose, Bld: 153 — ABNORMAL HIGH
Glucose, Bld: 116 — ABNORMAL HIGH
Potassium: 3.5
Sodium: 135

## 2010-12-27 LAB — URINALYSIS, ROUTINE W REFLEX MICROSCOPIC
Bilirubin Urine: NEGATIVE
Hgb urine dipstick: NEGATIVE
Specific Gravity, Urine: 1.024
Urobilinogen, UA: 0.2

## 2010-12-27 LAB — DIFFERENTIAL
Basophils Absolute: 0
Eosinophils Absolute: 0
Eosinophils Absolute: 0.1
Eosinophils Relative: 2
Lymphs Abs: 1.4
Lymphs Abs: 2.2
Monocytes Relative: 7
Neutro Abs: 8.9 — ABNORMAL HIGH
Neutrophils Relative %: 80 — ABNORMAL HIGH

## 2010-12-27 LAB — TYPE AND SCREEN: Antibody Screen: NEGATIVE

## 2010-12-27 LAB — PROTIME-INR
INR: 1.6 — ABNORMAL HIGH
INR: 3.2 — ABNORMAL HIGH
Prothrombin Time: 27 — ABNORMAL HIGH
Prothrombin Time: 13.1

## 2010-12-27 LAB — ABO/RH: ABO/RH(D): O POS

## 2019-11-25 HISTORY — PX: OTHER SURGICAL HISTORY: SHX169

## 2021-05-11 ENCOUNTER — Inpatient Hospital Stay (HOSPITAL_COMMUNITY)
Admission: EM | Admit: 2021-05-11 | Discharge: 2021-05-24 | DRG: 480 | Disposition: A | Discharge status: Skilled Nursing Facility | Payer: No Typology Code available for payment source | Attending: Internal Medicine | Admitting: Internal Medicine

## 2021-05-11 ENCOUNTER — Emergency Department (HOSPITAL_COMMUNITY): Payer: No Typology Code available for payment source

## 2021-05-11 ENCOUNTER — Inpatient Hospital Stay (HOSPITAL_COMMUNITY): Payer: No Typology Code available for payment source

## 2021-05-11 ENCOUNTER — Encounter (HOSPITAL_COMMUNITY): Payer: Self-pay

## 2021-05-11 ENCOUNTER — Other Ambulatory Visit: Payer: Self-pay

## 2021-05-11 DIAGNOSIS — Z419 Encounter for procedure for purposes other than remedying health state, unspecified: Secondary | ICD-10-CM

## 2021-05-11 DIAGNOSIS — K219 Gastro-esophageal reflux disease without esophagitis: Secondary | ICD-10-CM | POA: Diagnosis present

## 2021-05-11 DIAGNOSIS — R7989 Other specified abnormal findings of blood chemistry: Secondary | ICD-10-CM | POA: Diagnosis present

## 2021-05-11 DIAGNOSIS — M9711XA Periprosthetic fracture around internal prosthetic right knee joint, initial encounter: Secondary | ICD-10-CM | POA: Diagnosis present

## 2021-05-11 DIAGNOSIS — I10 Essential (primary) hypertension: Secondary | ICD-10-CM | POA: Diagnosis present

## 2021-05-11 DIAGNOSIS — Z8673 Personal history of transient ischemic attack (TIA), and cerebral infarction without residual deficits: Secondary | ICD-10-CM | POA: Diagnosis not present

## 2021-05-11 DIAGNOSIS — Y92009 Unspecified place in unspecified non-institutional (private) residence as the place of occurrence of the external cause: Secondary | ICD-10-CM | POA: Diagnosis not present

## 2021-05-11 DIAGNOSIS — Y92239 Unspecified place in hospital as the place of occurrence of the external cause: Secondary | ICD-10-CM | POA: Diagnosis not present

## 2021-05-11 DIAGNOSIS — D62 Acute posthemorrhagic anemia: Secondary | ICD-10-CM | POA: Diagnosis not present

## 2021-05-11 DIAGNOSIS — J449 Chronic obstructive pulmonary disease, unspecified: Secondary | ICD-10-CM | POA: Diagnosis present

## 2021-05-11 DIAGNOSIS — T426X5A Adverse effect of other antiepileptic and sedative-hypnotic drugs, initial encounter: Secondary | ICD-10-CM | POA: Diagnosis not present

## 2021-05-11 DIAGNOSIS — S72401A Unspecified fracture of lower end of right femur, initial encounter for closed fracture: Secondary | ICD-10-CM | POA: Diagnosis present

## 2021-05-11 DIAGNOSIS — Z09 Encounter for follow-up examination after completed treatment for conditions other than malignant neoplasm: Secondary | ICD-10-CM

## 2021-05-11 DIAGNOSIS — T40605A Adverse effect of unspecified narcotics, initial encounter: Secondary | ICD-10-CM | POA: Diagnosis not present

## 2021-05-11 DIAGNOSIS — I639 Cerebral infarction, unspecified: Secondary | ICD-10-CM | POA: Insufficient documentation

## 2021-05-11 DIAGNOSIS — Z96652 Presence of left artificial knee joint: Secondary | ICD-10-CM | POA: Diagnosis present

## 2021-05-11 DIAGNOSIS — Z9889 Other specified postprocedural states: Secondary | ICD-10-CM

## 2021-05-11 DIAGNOSIS — Z950 Presence of cardiac pacemaker: Secondary | ICD-10-CM | POA: Diagnosis not present

## 2021-05-11 DIAGNOSIS — Z96643 Presence of artificial hip joint, bilateral: Secondary | ICD-10-CM | POA: Diagnosis present

## 2021-05-11 DIAGNOSIS — M9701XA Periprosthetic fracture around internal prosthetic right hip joint, initial encounter: Secondary | ICD-10-CM | POA: Diagnosis not present

## 2021-05-11 DIAGNOSIS — K8020 Calculus of gallbladder without cholecystitis without obstruction: Secondary | ICD-10-CM | POA: Diagnosis present

## 2021-05-11 DIAGNOSIS — S7291XA Unspecified fracture of right femur, initial encounter for closed fracture: Secondary | ICD-10-CM | POA: Diagnosis not present

## 2021-05-11 DIAGNOSIS — I452 Bifascicular block: Secondary | ICD-10-CM | POA: Diagnosis present

## 2021-05-11 DIAGNOSIS — T1490XA Injury, unspecified, initial encounter: Secondary | ICD-10-CM

## 2021-05-11 DIAGNOSIS — R112 Nausea with vomiting, unspecified: Secondary | ICD-10-CM

## 2021-05-11 DIAGNOSIS — S93402A Sprain of unspecified ligament of left ankle, initial encounter: Secondary | ICD-10-CM | POA: Diagnosis present

## 2021-05-11 DIAGNOSIS — Z91199 Patient's noncompliance with other medical treatment and regimen due to unspecified reason: Secondary | ICD-10-CM

## 2021-05-11 DIAGNOSIS — N400 Enlarged prostate without lower urinary tract symptoms: Secondary | ICD-10-CM | POA: Diagnosis present

## 2021-05-11 DIAGNOSIS — I4891 Unspecified atrial fibrillation: Secondary | ICD-10-CM | POA: Diagnosis not present

## 2021-05-11 DIAGNOSIS — I493 Ventricular premature depolarization: Secondary | ICD-10-CM | POA: Diagnosis present

## 2021-05-11 DIAGNOSIS — G9341 Metabolic encephalopathy: Secondary | ICD-10-CM | POA: Diagnosis not present

## 2021-05-11 DIAGNOSIS — F0390 Unspecified dementia without behavioral disturbance: Secondary | ICD-10-CM | POA: Diagnosis present

## 2021-05-11 DIAGNOSIS — W010XXA Fall on same level from slipping, tripping and stumbling without subsequent striking against object, initial encounter: Secondary | ICD-10-CM | POA: Diagnosis present

## 2021-05-11 DIAGNOSIS — I6623 Occlusion and stenosis of bilateral posterior cerebral arteries: Secondary | ICD-10-CM | POA: Diagnosis present

## 2021-05-11 DIAGNOSIS — I2510 Atherosclerotic heart disease of native coronary artery without angina pectoris: Secondary | ICD-10-CM | POA: Diagnosis present

## 2021-05-11 DIAGNOSIS — E871 Hypo-osmolality and hyponatremia: Secondary | ICD-10-CM

## 2021-05-11 DIAGNOSIS — E876 Hypokalemia: Secondary | ICD-10-CM | POA: Diagnosis not present

## 2021-05-11 DIAGNOSIS — N179 Acute kidney failure, unspecified: Secondary | ICD-10-CM | POA: Diagnosis present

## 2021-05-11 DIAGNOSIS — Z20822 Contact with and (suspected) exposure to covid-19: Secondary | ICD-10-CM | POA: Diagnosis present

## 2021-05-11 DIAGNOSIS — D72829 Elevated white blood cell count, unspecified: Secondary | ICD-10-CM | POA: Diagnosis not present

## 2021-05-11 DIAGNOSIS — T4145XA Adverse effect of unspecified anesthetic, initial encounter: Secondary | ICD-10-CM | POA: Diagnosis not present

## 2021-05-11 DIAGNOSIS — R9431 Abnormal electrocardiogram [ECG] [EKG]: Secondary | ICD-10-CM

## 2021-05-11 DIAGNOSIS — S72351A Displaced comminuted fracture of shaft of right femur, initial encounter for closed fracture: Principal | ICD-10-CM

## 2021-05-11 DIAGNOSIS — J410 Simple chronic bronchitis: Secondary | ICD-10-CM | POA: Diagnosis not present

## 2021-05-11 DIAGNOSIS — S72301A Unspecified fracture of shaft of right femur, initial encounter for closed fracture: Secondary | ICD-10-CM | POA: Diagnosis not present

## 2021-05-11 DIAGNOSIS — G4733 Obstructive sleep apnea (adult) (pediatric): Secondary | ICD-10-CM | POA: Diagnosis present

## 2021-05-11 HISTORY — DX: Chronic obstructive pulmonary disease, unspecified: J44.9

## 2021-05-11 HISTORY — DX: Cerebral infarction, unspecified: I63.9

## 2021-05-11 HISTORY — DX: Unspecified osteoarthritis, unspecified site: M19.90

## 2021-05-11 HISTORY — DX: Essential (primary) hypertension: I10

## 2021-05-11 LAB — PROTIME-INR
INR: 1 (ref 0.8–1.2)
Prothrombin Time: 13.5 seconds (ref 11.4–15.2)

## 2021-05-11 LAB — I-STAT CHEM 8, ED
BUN: 20 mg/dL (ref 8–23)
Calcium, Ion: 1.01 mmol/L — ABNORMAL LOW (ref 1.15–1.40)
Chloride: 100 mmol/L (ref 98–111)
Creatinine, Ser: 1.1 mg/dL (ref 0.61–1.24)
Glucose, Bld: 128 mg/dL — ABNORMAL HIGH (ref 70–99)
HCT: 40 % (ref 39.0–52.0)
Hemoglobin: 13.6 g/dL (ref 13.0–17.0)
Potassium: 4.8 mmol/L (ref 3.5–5.1)
Sodium: 133 mmol/L — ABNORMAL LOW (ref 135–145)
TCO2: 23 mmol/L (ref 22–32)

## 2021-05-11 LAB — RESP PANEL BY RT-PCR (FLU A&B, COVID) ARPGX2
Influenza A by PCR: NEGATIVE
Influenza B by PCR: NEGATIVE
SARS Coronavirus 2 by RT PCR: NEGATIVE

## 2021-05-11 LAB — SAMPLE TO BLOOD BANK

## 2021-05-11 LAB — LACTIC ACID, PLASMA: Lactic Acid, Venous: 3.7 mmol/L (ref 0.5–1.9)

## 2021-05-11 LAB — ETHANOL: Alcohol, Ethyl (B): <10 mg/dL (ref <10)

## 2021-05-11 MED ORDER — PROCHLORPERAZINE EDISYLATE 10 MG/2ML IJ SOLN: 10.0000 mg | Freq: Four times a day (QID) | INTRAMUSCULAR | Status: DC | PRN

## 2021-05-11 MED ORDER — SENNOSIDES-DOCUSATE SODIUM 8.6-50 MG PO TABS
1.0000 | ORAL_TABLET | Freq: Every evening | ORAL | Status: DC | PRN
  Administered 2021-05-23: 1 via ORAL
  Filled 2021-05-11: qty 1

## 2021-05-11 MED ORDER — IOHEXOL 350 MG/ML SOLN
75.0000 mL | Freq: Once | INTRAVENOUS | Status: AC | PRN
  Administered 2021-05-11: 75 mL via INTRAVENOUS

## 2021-05-11 MED ORDER — LACTATED RINGERS IV SOLN: INTRAVENOUS | Status: DC

## 2021-05-11 MED ORDER — ACETAMINOPHEN 650 MG RE SUPP
650.0000 mg | Freq: Four times a day (QID) | RECTAL | Status: DC | PRN
  Administered 2021-05-14: 650 mg via RECTAL
  Filled 2021-05-11: qty 1

## 2021-05-11 MED ORDER — ACETAMINOPHEN 325 MG PO TABS
650.0000 mg | ORAL_TABLET | Freq: Four times a day (QID) | ORAL | Status: DC | PRN
Start: 2021-05-11 — End: 2021-05-24
  Administered 2021-05-12 – 2021-05-15 (×4): 650 mg via ORAL
  Filled 2021-05-11 (×5): qty 2

## 2021-05-11 MED ORDER — IPRATROPIUM-ALBUTEROL 0.5-2.5 (3) MG/3ML IN SOLN: 3.0000 mL | RESPIRATORY_TRACT | Status: DC | PRN

## 2021-05-11 MED ORDER — KETOROLAC TROMETHAMINE 15 MG/ML IJ SOLN
15.0000 mg | Freq: Once | INTRAMUSCULAR | Status: AC
  Administered 2021-05-12: 15 mg via INTRAVENOUS
  Filled 2021-05-11: qty 1

## 2021-05-11 MED ORDER — FENTANYL CITRATE PF 50 MCG/ML IJ SOSY
50.0000 ug | PREFILLED_SYRINGE | Freq: Once | INTRAMUSCULAR | Status: AC
  Administered 2021-05-11: 50 ug via INTRAVENOUS
  Filled 2021-05-11: qty 1

## 2021-05-11 NOTE — H&P (Addendum)
History and Physical    Patient: Marcus Mcgee [id number] DOB: [birth year] DOA: 05/11/2021 DOS: the patient was seen and examined on 05/11/2021 PCP: Minus Breeding, MD  Patient coming from: Home  Chief Complaint:   Fall at home. Pain in right thigh  HPI: Marcus Mcgee is a 87 y.o. male with medical history significant of HTN, COPD, arrhythmia, hx of CVA, OA who presents by EMS after a fall at home.  He was at home with his wife when he got up to walk across the room and he lost his balance and fell landing on his right side.  He was not able to get up and he had an obvious deformity of his right thigh.  His children came over and called 911 and he was brought in by EMS.  Did not have any head trauma and denies loss of consciousness.  He denies any chest pain or palpitations prior to falling.  He states that he just lost his balance and fell.  Cording to the wife he has been having difficulty with his balance recently and has had a couple of falls but that he was not injured with.  She reports he saw neurology yesterday at [facility] for possible strokes.  Family reports he has been having difficulty with memory and will sometimes forget words or say the wrong words and a difficulty speaking which is why he went to the neurologist.  Neurologist is going to obtain an MRI at [facility] and a CT angiography of his head and neck in the next week or so according to the wife.  Wife reports that patient had to have a complete cardiac clearance work-up with nuclear stress test 2 years ago when he had hip surgery.  He has not seen cardiology since then.  He has never had a cardiac catheterization. Orthopedic surgery consulted by the emergency room patient was placed in Buck's traction.  Hospitalist service asked to admit for further management Family reports that he has had his hip surgery and other orthopedic surgeries with [facility] group in town.  They prefer [facility] for orthopedic care if possible  Review  of Systems: As mentioned in the history of present illness. All other systems reviewed and are negative. Past Medical History:  Diagnosis Date   Arthritis    COPD (chronic obstructive pulmonary disease) ([place])    Hypertension    Stroke [facility])    Past Surgical History:  Procedure Laterality Date   REPLACEMENT TOTAL KNEE Bilateral    TOTAL HIP ARTHROPLASTY Bilateral    Social History:  reports that he has never smoked. He has never used smokeless tobacco. He reports that he does not drink alcohol and does not use drugs.  No Known Allergies  History reviewed. No pertinent family history.  Prior to Admission medications   Not on File    Physical Exam: Vitals:   05/11/21 2152 05/11/21 2155 05/11/21 2157 05/11/21 2230  BP: 110/80 137/90  125/83  Pulse:  83  90  Resp:  (!) 21  (!) 23  Temp:   98 F (36.7 C)   TempSrc:   Oral   SpO2:  100%  100%  Weight: 90.7 kg     Height: 6\' 3"  (1.905 m)      General: WDWN, Alert and oriented x3.  Eyes: EOMI, PERRL, conjunctivae normal.  Sclera nonicteric HENT:  [place]/AT, external ears normal.  Nares patent without epistasis.  Mucous membranes are moist. Neck: Soft, normal range of motion, supple, no masses,  no Trachea midline Respiratory: clear to auscultation bilaterally, no wheezing, no crackles. Normal respiratory effort. No accessory muscle use.  Cardiovascular: Regular rate and rhythm, no murmurs / rubs / gallops. Has edema left ankle. 1+ pedal pulses. Pacemaker in left upper chest Abdomen: Soft, no tenderness, nondistended, no rebound or guarding.  No masses palpated. Bowel sounds normoactive Musculoskeletal: FROM. no cyanosis.  Deformity of distal right femur.  Swelling of left ankle. Skin: Warm, dry, intact no rashes, lesions, ulcers. No induration Neurologic: CN 2-12 grossly intact. Normal speech.  Sensation intact to touch  Psychiatric:Normal mood.    Data Reviewed: Lab Work: Hemoglobin 13.6 hematocrit 40.0 sodium 133 potassium 4.8  chloride 100 creatinine 1.10 BUN 20 glucose 128   COVID-negative  Influenza A and B negative alcohol level ordered by ER and is pending  EKG shows junctional rhythm with occasional PVC.  No acute ST elevation or depression.  QTc 489  Chest x-ray-dual-lead pacemaker in place.  Cardiac silhouette normal.  No infiltrate, consolidation, pleural effusion, pulmonary edema or pneumothorax.  Left ankle x-ray shows mild to moderate lateral malleoli or soft tissue swelling but no fracture identified  Right femur x-ray shows a comminuted periprosthetic fracture of the mid and distal right femur with external rotation of distal fragments  Pelvic x-ray shows no fracture of the hips  Assessment and Plan: * Closed fracture of right femur, unspecified fracture morphology, initial encounter ([place])- (present on admission) Marcus Mcgee is admitted to Med telemetry floor.  Orthopedics is consulted and will see pt in the am.  Pain control overnight with Dilaudid.  Placed in Buck's traction by Ortho IVF hydration with LR  Essential hypertension Monitor BP.  Home medications will be obtained and verified by Pharmacy.   BP is controlled at this time  COPD (chronic obstructive pulmonary disease) ([place]) Home medications will be obtained by pharmacy and verified.  Duonebs every 4 hour as needed for SOB, cough  Hyponatremia IVF hydration with LR Check electrolytes and renal function in am  Prolonged QT interval Avoid medications which could further prolong QT interval  History of CVA (cerebrovascular accident) Had appointment with Neurology at [facility] yesterday for periods of confusion and forgetting things per family. Neurology was going to order MRI brain at [facility] and CTA head and neck to make sure no acute CVA or LVO.   Will obtain CTA head and neck tonight to make sure no LVO before proceeding with surgery Check Echocardiogram in am  Pacemaker stable  Advance Care Planning:   Code Status:    Full Code.   SCD placed on left leg overnight.  Consults: orthopedics  Family Communication: Diagnosis and plan discussed with patient and his family was at bedside.  Questions answered.  They verbalized understanding and agree with plan.  Further recommendations to follow as clinically indicated  Author: Eben Burow, MD 05/11/2021 11:07 PM  For on call review [email or web address].

## 2021-05-11 NOTE — ED Notes (Signed)
Dr. Chotiner at bedside  

## 2021-05-11 NOTE — Progress Notes (Signed)
°   05/11/21 2120  Clinical Encounter Type  Visited With Patient and family together  Visit Type Trauma  Referral From Nurse  Consult/Referral To Chaplain   Chaplain Jorene Guest responded to page. The patient is being attended to by the medical team. Family member at his bedside. Chaplain checked in, based on lack of response was not a good time for visit. Chaplain remains available if support is needed.This note was prepared by Jeanine Luz, M.Div..  For questions please contact by phone [phone].

## 2021-05-11 NOTE — ED Triage Notes (Signed)
Pt BIB EMS from home - level 2 fall - not on thinners - obv deformities to the right femur.  Per EMS pts legs gave out while he was walking - decreased pulse on the right side +1 sensation intact and able to move leg. NO LOC - denies hitting head.  Pt has hx of bilat hip and knee replacements.  20RFA  36 ketamine given en route  VSS with ems

## 2021-05-11 NOTE — Assessment & Plan Note (Addendum)
Currently stable, continue bronchodilators as needed.

## 2021-05-11 NOTE — ED Notes (Signed)
MD Chotiner notified of lactic level

## 2021-05-11 NOTE — Assessment & Plan Note (Addendum)
Avoid medications which could further prolong QT interval.On flecainide

## 2021-05-11 NOTE — Assessment & Plan Note (Signed)
stable °

## 2021-05-11 NOTE — ED Notes (Signed)
Pt back from CT - Ortho at bedside - pt placed on traction at this time

## 2021-05-11 NOTE — Assessment & Plan Note (Addendum)
Resolved

## 2021-05-11 NOTE — ED Provider Notes (Signed)
[facility] EMERGENCY DEPARTMENT Provider Note   CSN: [id number] Arrival date & time: 05/11/21  2122     History  No chief complaint on file.   Marcus Mcgee is a 87 y.o. male with history of COPD, hypertension, stroke and previous surgical history of bilateral hip arthroplasty and bilateral knee replacements who presents to the ED for evaluation of right leg pain after a ground-level fall that occurred earlier today.  Per patient's wife, she noted that his legs gave out while he was walking.  She states that she saw his right knee and for traumatically as he fell.  Since then he has developed acute swelling of the knee and upper leg with outward rotation.  Patient is not on blood thinners.  Per EMS, he was given 36 ketamine in route.  Patient is complaining of pain to his right leg.  He denies numbness and tingling.  He denies headache, chest pain, stomach pain, nausea, vomiting and diarrhea.  HPI     Home Medications Prior to Admission medications   Not on File      Allergies    Patient has no known allergies.    Review of Systems   Review of Systems  Physical Exam Updated Vital Signs BP 137/90    Pulse 83    Temp 98 F (36.7 C) (Oral)    Resp (!) 21    Ht 6\' 3"  (1.905 m)    Wt 90.7 kg    SpO2 100%    BMI 25.00 kg/m  Physical Exam Vitals and nursing note reviewed.  Constitutional:      General: He is not in acute distress.    Appearance: He is not ill-appearing.  HENT:     Head: Atraumatic.  Eyes:     Conjunctiva/sclera: Conjunctivae normal.  Cardiovascular:     Rate and Rhythm: Normal rate and regular rhythm.     Pulses:          Radial pulses are 2+ on the right side and 2+ on the left side.       Dorsalis pedis pulses are 1+ on the right side and 2+ on the left side.     Heart sounds: No murmur heard. Pulmonary:     Effort: Pulmonary effort is normal. No respiratory distress.     Breath sounds: Normal breath sounds.  Abdominal:     General:  Abdomen is flat. There is no distension.     Palpations: Abdomen is soft.     Tenderness: There is no abdominal tenderness.  Musculoskeletal:     Cervical back: Normal range of motion.     Comments: Patient with large swelling and tenderness to the right knee.  Outward rotation of the right hip and obvious deformity of the right thigh.  Neurovascularly intact.  Skin:    General: Skin is warm and dry.     Capillary Refill: Capillary refill takes less than 2 seconds.  Neurological:     General: No focal deficit present.     Mental Status: He is alert.  Psychiatric:        Mood and Affect: Mood normal.    ED Results / Procedures / Treatments   Labs (all labs ordered are listed, but only abnormal results are displayed) Labs Reviewed  I-STAT CHEM 8, ED - Abnormal; Notable for the following components:      Result Value   Sodium 133 (*)    Glucose, Bld 128 (*)    Calcium,  Ion 1.01 (*)    All other components within normal limits  RESP PANEL BY RT-PCR (FLU A&B, COVID) ARPGX2  COMPREHENSIVE METABOLIC PANEL  CBC  ETHANOL  URINALYSIS, ROUTINE W REFLEX MICROSCOPIC  LACTIC ACID, PLASMA  PROTIME-INR  SAMPLE TO BLOOD BANK    EKG None  Radiology DG Ankle Complete Left  Result Date: 05/11/2021 CLINICAL DATA:  Level 2 trauma.  Fall. EXAM: LEFT ANKLE COMPLETE - 3+ VIEW COMPARISON:  None. FINDINGS: Large calcaneal heel spur. Minimal chronic enthesopathic change at the Achilles insertion on the calcaneus. Mild medial tibiotalar joint space narrowing. Moderate talonavicular joint space narrowing. No acute fracture or dislocation. Mild-to-moderate lateral malleolar soft tissue swelling. IMPRESSION:: IMPRESSION: 1. Mild-to-moderate lateral malleolar soft tissue swelling. No acute fracture is seen. 2. Large calcaneal heel spur. Electronically Signed   By: Yvonne Kendall M.D.   On: 05/11/2021 21:59   DG Pelvis Portable  Result Date: 05/11/2021 CLINICAL DATA:  [place] EXAM: PORTABLE PELVIS 1-2 VIEWS  COMPARISON:  None. FINDINGS: Single frontal view of the pelvis excludes portions of the left iliac crest and proximal right femur by collimation. Bilateral hip arthroplasties are in the expected position without signs of acute complication. No acute displaced fractures. Soft tissues are unremarkable. IMPRESSION: 1. Unremarkable pelvis and bilateral hips. Electronically Signed   By: Randa Ngo M.D.   On: 05/11/2021 22:09   DG Chest Port 1 View  Result Date: 05/11/2021 CLINICAL DATA:  Level 2 trauma, fell EXAM: PORTABLE CHEST 1 VIEW COMPARISON:  03/20/2013 FINDINGS: Single frontal view of the chest demonstrates stable dual lead pacer. The cardiac silhouette is unremarkable. No acute airspace disease, effusion, or pneumothorax. No acute displaced fractures. Stable posttraumatic or postsurgical changes of the right fourth rib. IMPRESSION: 1. No acute intrathoracic process. Electronically Signed   By: Randa Ngo M.D.   On: 05/11/2021 22:06   DG FEMUR PORT, 1V RIGHT  Result Date: 05/11/2021 CLINICAL DATA:  [place] EXAM: RIGHT FEMUR PORTABLE 1 VIEW COMPARISON:  None. FINDINGS: Two frontal views of the right femur are obtained. There is a comminuted displaced fracture involving the mid femoral diaphysis. The fracture extends along the posterior margin of the distal femur, to the level of the femoral component of the right knee arthroplasty. There is external rotation of the distal fracture fragments. Orthopedic hardware from the right hip and right knee arthroplasties appear unremarkable. IMPRESSION: 1. Comminuted periprosthetic fracture of the mid and distal right femur as above. External rotation of the distal fracture fragments. Electronically Signed   By: Randa Ngo M.D.   On: 05/11/2021 22:09    Procedures .Ortho Injury Treatment  Date/Time: 05/11/2021 10:19 PM Performed by: Tonye Pearson, PA-C Authorized by: Tonye Pearson, PA-C   Consent:    Consent obtained:  Verbal   Consent given  by:  Patient and spouse   Risks discussed:  Fracture   Alternatives discussed:  No treatmentInjury location: upper leg Location details: right upper leg Injury type: fracture-dislocation Pre-procedure distal perfusion: diminished Pre-procedure neurological function: normal Pre-procedure range of motion: reduced  Anesthesia: Local anesthesia used: no  Patient sedated: NoImmobilization: Buck's traction. Splint Applied by: Sheliah Hatch Post-procedure distal perfusion: diminished Post-procedure neurological function: normal Post-procedure range of motion: unchanged   .Critical Care Performed by: Tonye Pearson, PA-C Authorized by: Tonye Pearson, PA-C   Critical care provider statement:    Critical care time (minutes):  30   Critical care start time:  05/11/2021 9:30 PM   Critical care end time:  05/11/2021 10:00 PM   Critical care was necessary to treat or prevent imminent or life-threatening deterioration of the following conditions:  Trauma   Critical care was time spent personally by me on the following activities:  Development of treatment plan with patient or surrogate, discussions with consultants, evaluation of patient's response to treatment, examination of patient, ordering and review of laboratory studies, ordering and review of radiographic studies, ordering and performing treatments and interventions, pulse oximetry, re-evaluation of patient's condition and review of old charts   I assumed direction of critical care for this patient from another provider in my specialty: no     Care discussed with: admitting provider      Medications Ordered in ED Medications  fentaNYL (SUBLIMAZE) injection 50 mcg (has no administration in time range)    ED Course/ Medical Decision Making/ A&P                           Medical Decision Making Amount and/or Complexity of Data Reviewed Labs: ordered. Radiology: ordered.  Risk Prescription drug management.   History:  Per  HPI Social determinants of health: None  Initial impression:  This patient presents to the ED for concern of leg injury from trauma, this involves an extensive number of treatment options, and is a complaint that carries with it a high risk of complications and morbidity.    This is an acutely distressed 87 year old male with obvious deformities and swelling of the right lower extremity from the knee extending proximal to the hip.  He does have palpable DP pulses and is neurovascularly intact.  X-ray of the pelvis, right femur, and chest and left ankle were ordered.  Trauma labs also ordered.   Lab Tests and EKG:  I Ordered, reviewed, and interpreted labs and EKG.  The pertinent results include:  I-STAT Chem-8 without acute abnormality Pending CMP, respiratory panel, INR, lactic acid, ethanol, CBC and UA   Imaging Studies ordered:  I ordered imaging studies including  Right femur x-ray with comminuted periprosthetic fracture of the mid and distal right femur.  External rotation of the distal fracture fragments Pelvis x-ray without acute fractures or abnormalities Left ankle x-ray with lateral malleolar soft tissue swelling without acute fracture I independently visualized and interpreted imaging and I agree with the radiologist interpretation.    Cardiac Monitoring:  The patient was maintained on a cardiac monitor.  I personally viewed and interpreted the cardiac monitored which showed an underlying rhythm of: NSR   Medicines ordered and prescription drug management:  I ordered medication including: Fentanyl 50 mcg for pain Reevaluation of the patient after these medicines showed that the patient improved I have reviewed the patients home medicines and have made adjustments as needed   Critical Interventions:  Trauma management as described above  Consultations Obtained:  I requested consultation with Dr.Bokshan,  and discussed lab and imaging findings as well as pertinent  plan - they recommend: Pain management, apply Buck's traction, admit to medicine with orders to n.p.o. after midnight and patient will have surgery done tomorrow.  Disposition:  After consideration of the diagnostic results, physical exam, history and the patients response to treatment feel that the patent would benefit from admission.   Close displaced comminuted fracture of the right femur shaft: Dr. Tonie Griffith agrees to admit patient with Dr. Darrold Span from surgery consulting with plans to perform surgery tomorrow.   Final Clinical Impression(s) / ED Diagnoses Final diagnoses:  Trauma  Closed  displaced comminuted fracture of shaft of right femur, initial encounter [facility])    Rx / DC Orders ED Discharge Orders     None         Rodena Piety 05/11/21 2248    Lucrezia Starch, MD 05/15/21 [phone]

## 2021-05-11 NOTE — Assessment & Plan Note (Addendum)
Orthopedic surgery consulted, status post ORIF of the distal femur on 05/12/2021 Pain management, DVT PPx per orthopedics (holding narcotics) Continue ASA 325 mg as DVT ppx PT/OT- SNF Patient also sprained his left ankle, currently on a short boot

## 2021-05-11 NOTE — Assessment & Plan Note (Addendum)
Monitor BP.  Continue current medications

## 2021-05-11 NOTE — Assessment & Plan Note (Addendum)
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

## 2021-05-11 NOTE — ED Notes (Signed)
Marcus Mcgee for MD

## 2021-05-11 NOTE — ED Notes (Signed)
MD allowed multiple family members back to room

## 2021-05-12 ENCOUNTER — Inpatient Hospital Stay (HOSPITAL_COMMUNITY): Payer: No Typology Code available for payment source

## 2021-05-12 ENCOUNTER — Inpatient Hospital Stay (HOSPITAL_COMMUNITY): Payer: No Typology Code available for payment source | Admitting: Anesthesiology

## 2021-05-12 ENCOUNTER — Encounter (HOSPITAL_COMMUNITY)
Admission: EM | Disposition: A | Discharge status: Skilled Nursing Facility | Payer: Self-pay | Source: Home / Self Care | Attending: Internal Medicine

## 2021-05-12 ENCOUNTER — Other Ambulatory Visit: Payer: Self-pay

## 2021-05-12 ENCOUNTER — Encounter (HOSPITAL_COMMUNITY): Payer: Self-pay | Admitting: Family Medicine

## 2021-05-12 DIAGNOSIS — I10 Essential (primary) hypertension: Secondary | ICD-10-CM

## 2021-05-12 DIAGNOSIS — M9701XA Periprosthetic fracture around internal prosthetic right hip joint, initial encounter: Secondary | ICD-10-CM

## 2021-05-12 DIAGNOSIS — S72301A Unspecified fracture of shaft of right femur, initial encounter for closed fracture: Secondary | ICD-10-CM

## 2021-05-12 DIAGNOSIS — J410 Simple chronic bronchitis: Secondary | ICD-10-CM

## 2021-05-12 DIAGNOSIS — Z8673 Personal history of transient ischemic attack (TIA), and cerebral infarction without residual deficits: Secondary | ICD-10-CM | POA: Diagnosis not present

## 2021-05-12 DIAGNOSIS — S7291XA Unspecified fracture of right femur, initial encounter for closed fracture: Secondary | ICD-10-CM | POA: Diagnosis not present

## 2021-05-12 DIAGNOSIS — E871 Hypo-osmolality and hyponatremia: Secondary | ICD-10-CM

## 2021-05-12 DIAGNOSIS — I4891 Unspecified atrial fibrillation: Secondary | ICD-10-CM

## 2021-05-12 HISTORY — PX: ORIF FEMUR FRACTURE: SHX2119

## 2021-05-12 LAB — BASIC METABOLIC PANEL
Anion gap: 9 (ref 5–15)
BUN: 23 mg/dL (ref 8–23)
CO2: 24 mmol/L (ref 22–32)
Calcium: 8.9 mg/dL (ref 8.9–10.3)
Chloride: 100 mmol/L (ref 98–111)
Creatinine, Ser: 1.46 mg/dL — ABNORMAL HIGH (ref 0.61–1.24)
GFR, Estimated: 47 mL/min — ABNORMAL LOW (ref >60)
Glucose, Bld: 142 mg/dL — ABNORMAL HIGH (ref 70–99)
Potassium: 5.1 mmol/L (ref 3.5–5.1)
Sodium: 133 mmol/L — ABNORMAL LOW (ref 135–145)

## 2021-05-12 LAB — COMPREHENSIVE METABOLIC PANEL
ALT: 16 U/L (ref 0–44)
AST: 27 U/L (ref 15–41)
Albumin: 3.6 g/dL (ref 3.5–5.0)
Alkaline Phosphatase: 59 U/L (ref 38–126)
Anion gap: 11 (ref 5–15)
BUN: 18 mg/dL (ref 8–23)
CO2: 25 mmol/L (ref 22–32)
Calcium: 9 mg/dL (ref 8.9–10.3)
Chloride: 98 mmol/L (ref 98–111)
Creatinine, Ser: 1.25 mg/dL — ABNORMAL HIGH (ref 0.61–1.24)
GFR, Estimated: 57 mL/min — ABNORMAL LOW (ref >60)
Glucose, Bld: 127 mg/dL — ABNORMAL HIGH (ref 70–99)
Potassium: 5 mmol/L (ref 3.5–5.1)
Sodium: 134 mmol/L — ABNORMAL LOW (ref 135–145)
Total Bilirubin: 0.3 mg/dL (ref 0.3–1.2)
Total Protein: 5.7 g/dL — ABNORMAL LOW (ref 6.5–8.1)

## 2021-05-12 LAB — CBC
HCT: 29.8 % — ABNORMAL LOW (ref 39.0–52.0)
HCT: 39.4 % (ref 39.0–52.0)
Hemoglobin: 10.5 g/dL — ABNORMAL LOW (ref 13.0–17.0)
Hemoglobin: 13.2 g/dL (ref 13.0–17.0)
MCH: 30.4 pg (ref 26.0–34.0)
MCH: 30.5 pg (ref 26.0–34.0)
MCHC: 33.5 g/dL (ref 30.0–36.0)
MCHC: 35.2 g/dL (ref 30.0–36.0)
MCV: 86.4 fL (ref 80.0–100.0)
MCV: 91 fL (ref 80.0–100.0)
Platelets: 202 10*3/uL (ref 150–400)
Platelets: 228 10*3/uL (ref 150–400)
RBC: 3.45 MIL/uL — ABNORMAL LOW (ref 4.22–5.81)
RBC: 4.33 MIL/uL (ref 4.22–5.81)
RDW: 14.2 % (ref 11.5–15.5)
RDW: 14.5 % (ref 11.5–15.5)
WBC: 12.1 10*3/uL — ABNORMAL HIGH (ref 4.0–10.5)
WBC: 9.1 10*3/uL (ref 4.0–10.5)
nRBC: 0 % (ref 0.0–0.2)
nRBC: 0 % (ref 0.0–0.2)

## 2021-05-12 LAB — POCT I-STAT, CHEM 8
BUN: 23 mg/dL (ref 8–23)
Calcium, Ion: 1.23 mmol/L (ref 1.15–1.40)
Chloride: 98 mmol/L (ref 98–111)
Creatinine, Ser: 1.3 mg/dL — ABNORMAL HIGH (ref 0.61–1.24)
Glucose, Bld: 127 mg/dL — ABNORMAL HIGH (ref 70–99)
HCT: 27 % — ABNORMAL LOW (ref 39.0–52.0)
Hemoglobin: 9.2 g/dL — ABNORMAL LOW (ref 13.0–17.0)
Potassium: 4.1 mmol/L (ref 3.5–5.1)
Sodium: 134 mmol/L — ABNORMAL LOW (ref 135–145)
TCO2: 25 mmol/L (ref 22–32)

## 2021-05-12 LAB — ECHOCARDIOGRAM COMPLETE
AR max vel: 2.58 cm2
AV Area VTI: 2.4 cm2
AV Area mean vel: 2.56 cm2
AV Mean grad: 2 mmHg
AV Peak grad: 4.1 mmHg
Ao pk vel: 1.01 m/s
Area-P 1/2: 1.98 cm2
Height: 75 in
S' Lateral: 2.4 cm
Weight: 3200 oz

## 2021-05-12 SURGERY — OPEN REDUCTION INTERNAL FIXATION (ORIF) DISTAL FEMUR FRACTURE
Anesthesia: General | Laterality: Right

## 2021-05-12 MED ORDER — LIDOCAINE 2% (20 MG/ML) 5 ML SYRINGE
INTRAMUSCULAR | Status: DC | PRN
  Administered 2021-05-12: 30 mg via INTRAVENOUS

## 2021-05-12 MED ORDER — ACETAMINOPHEN 10 MG/ML IV SOLN
INTRAVENOUS | Status: AC
  Filled 2021-05-12: qty 100

## 2021-05-12 MED ORDER — ORAL CARE MOUTH RINSE: 15.0000 mL | Freq: Once | OROMUCOSAL | Status: AC

## 2021-05-12 MED ORDER — ACETAMINOPHEN 10 MG/ML IV SOLN
INTRAVENOUS | Status: DC | PRN
  Administered 2021-05-12: 1000 mg via INTRAVENOUS

## 2021-05-12 MED ORDER — PROPOFOL 10 MG/ML IV BOLUS
INTRAVENOUS | Status: DC | PRN
  Administered 2021-05-12: 40 mg via INTRAVENOUS
  Administered 2021-05-12: 20 mg via INTRAVENOUS
  Administered 2021-05-12: 120 mg via INTRAVENOUS

## 2021-05-12 MED ORDER — LACTATED RINGERS IV BOLUS
500.0000 mL | Freq: Once | INTRAVENOUS | Status: AC
  Administered 2021-05-12: 500 mL via INTRAVENOUS

## 2021-05-12 MED ORDER — FENTANYL CITRATE (PF) 250 MCG/5ML IJ SOLN
INTRAMUSCULAR | Status: AC
  Filled 2021-05-12: qty 5

## 2021-05-12 MED ORDER — VANCOMYCIN HCL 1000 MG IV SOLR
INTRAVENOUS | Status: AC
  Filled 2021-05-12: qty 20

## 2021-05-12 MED ORDER — CHLORHEXIDINE GLUCONATE 0.12 % MT SOLN
OROMUCOSAL | Status: AC
  Administered 2021-05-12: 15 mL via OROMUCOSAL
  Filled 2021-05-12: qty 15

## 2021-05-12 MED ORDER — BUPIVACAINE HCL (PF) 0.25 % IJ SOLN
INTRAMUSCULAR | Status: AC
  Filled 2021-05-12: qty 30

## 2021-05-12 MED ORDER — FENTANYL CITRATE (PF) 100 MCG/2ML IJ SOLN: 25.0000 ug | INTRAMUSCULAR | Status: DC | PRN

## 2021-05-12 MED ORDER — FENTANYL CITRATE PF 50 MCG/ML IJ SOSY
25.0000 ug | PREFILLED_SYRINGE | Freq: Once | INTRAMUSCULAR | Status: AC
  Administered 2021-05-12: 25 ug via INTRAVENOUS
  Filled 2021-05-12: qty 1

## 2021-05-12 MED ORDER — PHENYLEPHRINE HCL-NACL 20-0.9 MG/250ML-% IV SOLN
INTRAVENOUS | Status: DC | PRN
  Administered 2021-05-12: 75 ug/min via INTRAVENOUS

## 2021-05-12 MED ORDER — POLYETHYLENE GLYCOL 3350 17 G PO PACK
17.0000 g | PACK | Freq: Two times a day (BID) | ORAL | Status: AC
  Administered 2021-05-13 (×2): 17 g via ORAL
  Filled 2021-05-12 (×3): qty 1

## 2021-05-12 MED ORDER — MORPHINE SULFATE (PF) 4 MG/ML IV SOLN
4.0000 mg | INTRAVENOUS | Status: DC | PRN
  Administered 2021-05-12 – 2021-05-15 (×4): 4 mg via INTRAVENOUS
  Filled 2021-05-12 (×4): qty 1

## 2021-05-12 MED ORDER — HYDRALAZINE HCL 20 MG/ML IJ SOLN: 5.0000 mg | Freq: Three times a day (TID) | INTRAMUSCULAR | Status: DC | PRN

## 2021-05-12 MED ORDER — TRANEXAMIC ACID-NACL 1000-0.7 MG/100ML-% IV SOLN
INTRAVENOUS | Status: AC
  Filled 2021-05-12: qty 100

## 2021-05-12 MED ORDER — ROCURONIUM BROMIDE 10 MG/ML (PF) SYRINGE
PREFILLED_SYRINGE | INTRAVENOUS | Status: DC | PRN
Start: 2021-05-12 — End: 2021-05-12
  Administered 2021-05-12: 10 mg via INTRAVENOUS
  Administered 2021-05-12: 60 mg via INTRAVENOUS

## 2021-05-12 MED ORDER — CEFAZOLIN SODIUM-DEXTROSE 2-4 GM/100ML-% IV SOLN
2.0000 g | Freq: Three times a day (TID) | INTRAVENOUS | Status: AC
  Administered 2021-05-12 – 2021-05-13 (×2): 2 g via INTRAVENOUS
  Filled 2021-05-12 (×2): qty 100

## 2021-05-12 MED ORDER — ONDANSETRON HCL 4 MG/2ML IJ SOLN
INTRAMUSCULAR | Status: DC | PRN
  Administered 2021-05-12: 4 mg via INTRAVENOUS

## 2021-05-12 MED ORDER — ALBUMIN HUMAN 5 % IV SOLN: INTRAVENOUS | Status: DC | PRN

## 2021-05-12 MED ORDER — SODIUM CHLORIDE 0.9 % IR SOLN
Status: DC | PRN
  Administered 2021-05-12: 3000 mL

## 2021-05-12 MED ORDER — VANCOMYCIN HCL 1000 MG IV SOLR
INTRAVENOUS | Status: DC | PRN
  Administered 2021-05-12: 1000 mg via TOPICAL

## 2021-05-12 MED ORDER — ENOXAPARIN SODIUM 40 MG/0.4ML IJ SOSY
40.0000 mg | PREFILLED_SYRINGE | INTRAMUSCULAR | Status: DC
  Administered 2021-05-12 – 2021-05-13 (×2): 40 mg via SUBCUTANEOUS
  Filled 2021-05-12 (×2): qty 0.4

## 2021-05-12 MED ORDER — FENTANYL CITRATE (PF) 250 MCG/5ML IJ SOLN
INTRAMUSCULAR | Status: DC | PRN
  Administered 2021-05-12: 100 ug via INTRAVENOUS
  Administered 2021-05-12: 50 ug via INTRAVENOUS

## 2021-05-12 MED ORDER — CHLORHEXIDINE GLUCONATE 0.12 % MT SOLN: 15.0000 mL | Freq: Once | OROMUCOSAL | Status: AC

## 2021-05-12 MED ORDER — PHENYLEPHRINE 40 MCG/ML (10ML) SYRINGE FOR IV PUSH (FOR BLOOD PRESSURE SUPPORT)
PREFILLED_SYRINGE | INTRAVENOUS | Status: DC | PRN
  Administered 2021-05-12: 120 ug via INTRAVENOUS
  Administered 2021-05-12 (×3): 80 ug via INTRAVENOUS

## 2021-05-12 MED ORDER — PROPOFOL 500 MG/50ML IV EMUL
INTRAVENOUS | Status: DC | PRN
  Administered 2021-05-12: 100 ug/kg/min via INTRAVENOUS

## 2021-05-12 MED ORDER — SUGAMMADEX SODIUM 200 MG/2ML IV SOLN
INTRAVENOUS | Status: DC | PRN
  Administered 2021-05-12: 180 mg via INTRAVENOUS

## 2021-05-12 MED ORDER — LACTATED RINGERS IV SOLN: INTRAVENOUS | Status: DC | PRN

## 2021-05-12 MED ORDER — CHLORHEXIDINE GLUCONATE 4 % EX LIQD
60.0000 mL | Freq: Once | CUTANEOUS | Status: AC
  Administered 2021-05-12: 4 via TOPICAL

## 2021-05-12 MED ORDER — LACTATED RINGERS IV SOLN: INTRAVENOUS | Status: AC

## 2021-05-12 MED ORDER — CEFAZOLIN SODIUM-DEXTROSE 2-4 GM/100ML-% IV SOLN
2.0000 g | INTRAVENOUS | Status: AC
  Administered 2021-05-12: 2 g via INTRAVENOUS

## 2021-05-12 MED ORDER — TRANEXAMIC ACID-NACL 1000-0.7 MG/100ML-% IV SOLN
1000.0000 mg | INTRAVENOUS | Status: AC
  Administered 2021-05-12: 1000 mg via INTRAVENOUS

## 2021-05-12 MED ORDER — MORPHINE SULFATE (PF) 4 MG/ML IV SOLN
4.0000 mg | INTRAVENOUS | Status: DC | PRN
  Administered 2021-05-12: 4 mg via INTRAVENOUS
  Filled 2021-05-12: qty 1

## 2021-05-12 MED ORDER — METHOCARBAMOL 1000 MG/10ML IJ SOLN
500.0000 mg | Freq: Four times a day (QID) | INTRAVENOUS | Status: DC | PRN
  Administered 2021-05-12: 500 mg via INTRAVENOUS
  Filled 2021-05-12 (×3): qty 5

## 2021-05-12 MED ORDER — LACTATED RINGERS IV SOLN: INTRAVENOUS | Status: DC

## 2021-05-12 MED ORDER — 0.9 % SODIUM CHLORIDE (POUR BTL) OPTIME
TOPICAL | Status: DC | PRN
  Administered 2021-05-12: 1000 mL

## 2021-05-12 MED ORDER — CEFAZOLIN SODIUM-DEXTROSE 2-4 GM/100ML-% IV SOLN
INTRAVENOUS | Status: AC
  Filled 2021-05-12: qty 100

## 2021-05-12 SURGICAL SUPPLY — 68 items
BAG COUNTER SPONGE SURGICOUNT (BAG) ×2 IMPLANT
BIT DRILL 4.3 (BIT) ×2 IMPLANT
BIT DRILL 4.3X300MM (BIT) IMPLANT
BLADE SURG 10 STRL SS (BLADE) ×2 IMPLANT
BNDG CMPR MED 15X6 ELC VLCR LF (GAUZE/BANDAGES/DRESSINGS) ×1
BNDG COHESIVE 6X5 TAN STRL LF (GAUZE/BANDAGES/DRESSINGS) ×4 IMPLANT
BNDG ELASTIC 6X15 VLCR STRL LF (GAUZE/BANDAGES/DRESSINGS) ×1 IMPLANT
CLEANER TIP ELECTROSURG 2X2 (MISCELLANEOUS) ×2 IMPLANT
COVER MAYO STAND STRL (DRAPES) ×2 IMPLANT
COVER SURGICAL LIGHT HANDLE (MISCELLANEOUS) ×4 IMPLANT
DRAPE C-ARM 42X72 X-RAY (DRAPES) ×1 IMPLANT
DRAPE C-ARMOR (DRAPES) ×1 IMPLANT
DRAPE IMP U-DRAPE 54X76 (DRAPES) ×2 IMPLANT
DRAPE INCISE IOBAN 66X45 STRL (DRAPES) ×2 IMPLANT
DRAPE ORTHO SPLIT 77X108 STRL (DRAPES) ×2
DRAPE SURG ORHT 6 SPLT 77X108 (DRAPES) ×1 IMPLANT
DRAPE U-SHAPE 47X51 STRL (DRAPES) ×2 IMPLANT
DRILL BIT 4.3 (BIT) ×2
DURAPREP 26ML APPLICATOR (WOUND CARE) ×2 IMPLANT
ELECT REM PT RETURN 9FT ADLT (ELECTROSURGICAL) ×2
ELECTRODE REM PT RTRN 9FT ADLT (ELECTROSURGICAL) ×1 IMPLANT
FIBERTAPE CERCLAGE TLINK SUT (SUTURE) ×3 IMPLANT
GAUZE XEROFORM 5X9 LF (GAUZE/BANDAGES/DRESSINGS) ×1 IMPLANT
GLOVE SURG ORTHO LTX SZ9 (GLOVE) ×2 IMPLANT
GLOVE SURG UNDER POLY LF SZ9 (GLOVE) ×2 IMPLANT
GOWN STRL REUS W/ TWL XL LVL3 (GOWN DISPOSABLE) ×3 IMPLANT
GOWN STRL REUS W/TWL XL LVL3 (GOWN DISPOSABLE) ×6
K-WIRE 2.0 (WIRE) ×4
K-WIRE FXSTD 280X2XNS SS (WIRE) ×2
KIT BASIN OR (CUSTOM PROCEDURE TRAY) ×2 IMPLANT
KIT TURNOVER KIT B (KITS) ×2 IMPLANT
KWIRE FXSTD 280X2XNS SS (WIRE) IMPLANT
MANIFOLD NEPTUNE II (INSTRUMENTS) ×2 IMPLANT
NEEDLE 22X1 1/2 (OR ONLY) (NEEDLE) ×2 IMPLANT
NS IRRIG 1000ML POUR BTL (IV SOLUTION) ×2 IMPLANT
PACK ORTHO EXTREMITY (CUSTOM PROCEDURE TRAY) ×2 IMPLANT
PACK UNIVERSAL I (CUSTOM PROCEDURE TRAY) ×2 IMPLANT
PAD ARMBOARD 7.5X6 YLW CONV (MISCELLANEOUS) ×4 IMPLANT
PADDING CAST SYN 6 (CAST SUPPLIES) ×2
PADDING CAST SYNTHETIC 6X4 NS (CAST SUPPLIES) IMPLANT
PLATE RT DISTAL FEMUR NCB 18H (Plate) ×1 IMPLANT
SCREW 5.0 48MM (Screw) ×1 IMPLANT
SCREW 5.0 80MM (Screw) ×1 IMPLANT
SCREW CORT NCB SELFTAP 5.0X42 (Screw) ×1 IMPLANT
SCREW CORTICAL NCB 5.0X40 (Screw) ×2 IMPLANT
SCREW CORTICAL NCB 5.0X65 (Screw) ×1 IMPLANT
SCREW NCB 5.0X36MM (Screw) ×2 IMPLANT
SCREW NCB 5.0X38 (Screw) ×1 IMPLANT
SCREW NCB 5.0X46MM (Screw) ×1 IMPLANT
SCREW NCB 5.0X55MM (Screw) ×1 IMPLANT
SCREW NCB 5.0X85MM (Screw) ×2 IMPLANT
SET CYSTO W/LG BORE CLAMP LF (SET/KITS/TRAYS/PACK) ×1 IMPLANT
SPONGE T-LAP 18X18 ~~LOC~~+RFID (SPONGE) ×3 IMPLANT
STAPLER VISISTAT 35W (STAPLE) ×2 IMPLANT
STOCKINETTE IMPERVIOUS LG (DRAPES) ×2 IMPLANT
SUCTION FRAZIER HANDLE 10FR (MISCELLANEOUS) ×2
SUCTION TUBE FRAZIER 10FR DISP (MISCELLANEOUS) ×1 IMPLANT
SUT VIC AB 0 CT1 27 (SUTURE) ×4
SUT VIC AB 0 CT1 27XBRD ANBCTR (SUTURE) IMPLANT
SUT VIC AB 2-0 CT1 (SUTURE) ×1 IMPLANT
SUT VIC AB 2-0 CTB1 (SUTURE) ×1 IMPLANT
SUT VIC AB 3-0 CT1 27 (SUTURE) ×4
SUT VIC AB 3-0 CT1 TAPERPNT 27 (SUTURE) IMPLANT
SYR 20ML ECCENTRIC (SYRINGE) ×2 IMPLANT
TOWEL GREEN STERILE (TOWEL DISPOSABLE) ×2 IMPLANT
TOWEL GREEN STERILE FF (TOWEL DISPOSABLE) ×2 IMPLANT
TUBE CONNECTING 12X1/4 (SUCTIONS) ×2 IMPLANT
YANKAUER SUCT BULB TIP NO VENT (SUCTIONS) ×2 IMPLANT

## 2021-05-12 NOTE — ED Notes (Signed)
MD Chotiner notified that pt BP is a little soft - 500 LR bolus to be administered  ?

## 2021-05-12 NOTE — ED Notes (Signed)
Ortho surgeon at bedside 

## 2021-05-12 NOTE — ED Notes (Signed)
Pt moaning in bed and huffing air. Vital signs are WNL. Notified provider for  additional pain coverage. ? ?

## 2021-05-12 NOTE — ED Notes (Signed)
Re-adjusted with ortho tech assistance in bed. Placed both feet on the bed, weight hanging freely.  ?

## 2021-05-12 NOTE — ED Notes (Signed)
Pt has had no urinary output on this shift - pt bladder scan showed around - MD notified and 500 bolus of LR ordered and given  ?

## 2021-05-12 NOTE — Progress Notes (Signed)
Echocardiogram ?2D Echocardiogram has been performed. ? ?Marcus Mcgee ?05/12/2021, 10:15 AM ?

## 2021-05-12 NOTE — Op Note (Addendum)
? ?Date of Surgery: 05/12/2021 ? ?INDICATIONS: Mr. Marcus Mcgee is a 87 y.o.-year-old male with a right femoral shaft fracture as well as a right distal femoral periprosthetic fracture around the knee.  The risk and benefits of the procedure with discussed in detail and documented in the pre-operative evaluation. ? ?PREOPERATIVE DIAGNOSIS: 1.  Right femoral shaft fracture ?2.  Right periprosthetic distal femur fracture ? ?POSTOPERATIVE DIAGNOSIS: Same. ? ?PROCEDURE: 1.  Open reduction internal fixation right femoral shaft ?2.  Open reduction internal fixation right distal femoral periprosthetic fracture ? ?SURGEON: Yevonne Pax MD ? ?ASSISTANT: Jackey Loge ? ?ANESTHESIA:  general ? ?IV FLUIDS AND URINE: See anesthesia record. ? ?ANTIBIOTICS: Ancef 2 g ? ?ESTIMATED BLOOD LOSS: 500 ml ? ?IMPLANTS:  ?Implant Name Type Inv. Item Serial No. Manufacturer Lot No. LRB No. Used Action  ?PLATE RT DISTAL FEMUR NCB 18H - [id number] Plate PLATE RT DISTAL FEMUR NCB 18H [id number] [employer](ORTH,TRAU,BIO,SG) [id number] Right 1 Implanted  ?SCREW NCB 5.0X85MM - [id number] Screw SCREW NCB 5.0X85MM [id number] [employer](ORTH,TRAU,BIO,SG) [id number] Right 1 Implanted  ?SCREW NCB 5.0X38 - [id number] Screw SCREW NCB 5.0X38 [id number] [employer](ORTH,TRAU,BIO,SG) [id number] Right 1 Implanted  ?SCREW CORTICAL NCB 5.0X40 - [id number] Screw SCREW CORTICAL NCB 5.0X40 [id number] [employer](ORTH,TRAU,BIO,SG) [id number] Right 2 Implanted  ?SCREW CORT NCB SELFTAP 5.0X42 - [id number] Screw SCREW CORT NCB SELFTAP 5.0X42 [id number] [employer](ORTH,TRAU,BIO,SG) [id number] Right 1 Implanted  ?SCREW 5.0 48MM - [id number] Screw SCREW 5.0 48MM [id number] [employer](ORTH,TRAU,BIO,SG) [id number] Right 1 Implanted  ?SCREW NCB 5.0X55MM - [id number] Screw SCREW NCB 5.0X55MM [id number] [employer](ORTH,TRAU,BIO,SG) [id number] Right 1 Implanted  ?SCREW 5.0 80MM - [id number] Screw SCREW 5.0 80MM [id number] [employer](ORTH,TRAU,BIO,SG)  [id number] Right 1 Implanted  ?SCREW NCB 5.0X36MM - [id number] Screw SCREW NCB 5.0X36MM [id number] [employer](ORTH,TRAU,BIO,SG) [id number] Right 2 Implanted  ? ? ?DRAINS: None ? ?CULTURES: None ? ?COMPLICATIONS: none ? ?DESCRIPTION OF PROCEDURE:  ? ?The patient was identified in the preoperative holding area.  The correct site was marked cording universal protocol with nursing.  He was subsequently taken back to the operating room. ? ?Anesthesia was induced.  Buck's traction was removed.  The patient was prepped and draped in usual sterile fashion.  All bony prominences were padded.  Again final timeout was performed.  Ancef was given 1 hour prior to skin incision.  A direct lateral incision was made with 10 blade to incise through skin.  Cautery was used to achieve hemostasis.  15 blade was used to incise through IT band laterally and curved Mayo scissors were used to incise through the rest of the IT band.  Hohmann retractors were used to elevate the vastus lateralis over the femur.  Fracture hematoma was irrigated.  Fracture site was exposed as well as the distal aspect of the femur.  At this time the distal fracture fragment around the periprosthetic knee area was identified.  This was reduced provisionally with reduction clamp.  A fiber wire cerclage was placed around this and tightened.  This was passed with care to be just around bone.  This provided anatomic reduction of the distal periprosthetic femur fracture. ? ?At this time the shaft fracture was positioned in good alignment with a series of bumps as well as reduction clamp.  1 FiberWire cerclage was placed around this as well for provisional reduction.  At this time I was happy with the alignment in coronal and sagittal planes of the fracture.  As result the correct size plate was chosen to be distal on the knee  but also bypassed the fracture stem by at least 2 cortical limits.  The plate was secured distally.  At this time the rotation of the plate  was dialed in and pinned into place provisionally.  A proximal screw was placed.  1 additional screw was placed to dial in the femoral shaft distal fragment to compress this down the plate.  At this time and is happy with the coronal and sagittal alignment of the fracture fragment.  As result the remainder of the proximal and distal screws were placed.  Care was taken to have at least 2 bicortical screws proximal to the hip implant.  A total of 5 screws were used on each side of the fracture of the shaft.  Be performed showed good reduction on AP and lateral views.  The wounds were thoroughly irrigated.  1 g of vancomycin powder was placed.  The wounds were closed in layers of 0 Vicryl 2-0 Vicryl and staples.  All counts were correct at the end of the case he was taken the PACU without complication ? ? ? ? ? ?POSTOPERATIVE PLAN: The patient will be nonweightbearing on the right lower extremity.  He will begin working with physical therapy.  I will see him in 2 weeks for wound check and staple removal. ? ?Yevonne Pax, MD ?9:08 PM ? ? ? ?

## 2021-05-12 NOTE — Anesthesia Preprocedure Evaluation (Addendum)
Anesthesia Evaluation  ?Patient identified by MRN, date of birth, ID band ?Patient awake ? ? ? ?Reviewed: ?Allergy & Precautions, NPO status , Patient's Chart, lab work & pertinent test results ? ?History of Anesthesia Complications ?Negative for: history of anesthetic complications ? ?Airway ?Mallampati: II ? ?TM Distance: >3 FB ?Neck ROM: Full ? ? ? Dental ? ?(+) Partial Lower, Dental Advisory Given, Poor Dentition, Chipped, Missing ?  ?Pulmonary ?COPD,  COPD inhaler,  ?  ?breath sounds clear to auscultation ? ? ? ? ? ? Cardiovascular ?hypertension, Pt. on medications ?(-) angina+ dysrhythmias (prolonged QT) Atrial Fibrillation + pacemaker (for SSS)  ?Rhythm:Regular Rate:Normal ? ?EKG:  ?Accelerated junctional rhythm ?RBBB and LAFB ?Confirmed by Virgina Norfolk [phone]) on 05/12/2021 8:49:04 AM ? ?05/12/2021 ECHO: EF 65-70%, normal LVF, mild LVH, Grade 1 DD, normal RVF, trivial MR ?  ?Neuro/Psych ?CVA, No Residual Symptoms negative psych ROS  ? GI/Hepatic ?Neg liver ROS, GERD  Medicated and Controlled,  ?Endo/Other  ?negative endocrine ROS ? Renal/GU ?negative Renal ROS  ?negative genitourinary ?  ?Musculoskeletal ? ?(+) Arthritis , Osteoarthritis,   ? Abdominal ?  ?Peds ?negative pediatric ROS ?(+)  Hematology ? ?(+) Blood dyscrasia (Hb 10.5), anemia ,   ?Anesthesia Other Findings ?Distal femur fracture ? Reproductive/Obstetrics ?negative OB ROS ? ?  ? ? ? ? ? ? ? ? ? ? ? ? ? ?  ?  ? ? ? ? ?Anesthesia Physical ?Anesthesia Plan ? ?ASA: 3 ? ?Anesthesia Plan: General  ? ?Post-op Pain Management: Ofirmev IV (intra-op)*  ? ?Induction: Intravenous ? ?PONV Risk Score and Plan: 2 and Propofol infusion, TIVA and Treatment may vary due to age or medical condition ? ?Airway Management Planned: Oral ETT ? ?Additional Equipment: None ? ?Intra-op Plan:  ? ?Post-operative Plan: Extubation in OR ? ?Informed Consent: I have reviewed the patients History and Physical, chart, labs and discussed the  procedure including the risks, benefits and alternatives for the proposed anesthesia with the patient or authorized representative who has indicated his/her understanding and acceptance.  ? ? ? ?Consent reviewed with POA and Dental advisory given ? ?Plan Discussed with: CRNA and Surgeon ? ?Anesthesia Plan Comments: (Consent from patient and his wife)  ? ? ? ? ? ?Anesthesia Quick Evaluation ? ?

## 2021-05-12 NOTE — TOC CAGE-AID Note (Signed)
Transition of Care (TOC) - CAGE-AID Screening ? ? ?Patient Details  ?Name: Marcus Mcgee ?MRN: [id number] ?Date of Birth: 1934-08-05 ? ?Transition of Care (TOC) CM/SW Contact:    ?Izaias Krupka C Tarpley-Carter, LCSWA ?Phone Number: ?05/12/2021, 7:51 AM ? ? ?Clinical Narrative: ?Pt participated in Cage-Aid.  Pt stated he does not use substance or ETOH.  Pt was not offered resources, due to no usage of substance or ETOH.    ? ?Insurance underwriter, MSW, LCSW-A ?Pronouns:  She/Her/Hers ?[facility] ?Clinical Social Worker ?Direct Number:  [phone] ?[email or web address]  ? ? ?CAGE-AID Screening: ?  ? ?Have You Ever Felt You Ought to Cut Down on Your Drinking or Drug Use?: No ?Have People Annoyed You By Critizing Your Drinking Or Drug Use?: No ?Have You Felt Bad Or Guilty About Your Drinking Or Drug Use?: No ?Have You Ever Had a Drink or Used Drugs First Thing In The Morning to Steady Your Nerves or to Get Rid of a Hangover?: No ?CAGE-AID Score: 0 ? ?Substance Abuse Education Offered: No ? ?  ? ? ? ? ? ? ?

## 2021-05-12 NOTE — Consult Note (Signed)
ORTHOPAEDIC CONSULTATION  REQUESTING PHYSICIAN: Charlynne Cousins, MD  Chief Complaint: Right femoral shaft fracture  HPI: Marcus Mcgee is a 87 y.o. male who presents with a right femoral shaft fracture after a fall while standing.  He was getting up to walk with his wife and subsequently fell with an obvious deformity of the right femur.  He is currently being worked up for stroke in the setting of word finding difficulties which are more chronic in nature.  He presented to the emergency room was found to have a femoral shaft fracture.  Of note he does have a history of right hip and knee arthroplasty which per chart review were done many years prior.  On further asking him he states that these were both done proximately 6 months ago which appears to be an accurate.  He lives at home with his wife.  He does occasionally use a cane per his report  Past Medical History:  Diagnosis Date   Arthritis    COPD (chronic obstructive pulmonary disease) (HCC)    Hypertension    Stroke ([place])    Past Surgical History:  Procedure Laterality Date   REPLACEMENT TOTAL KNEE Bilateral    TOTAL HIP ARTHROPLASTY Bilateral    Social History   Socioeconomic History   Marital status: Married    Spouse name: Not on file   Number of children: Not on file   Years of education: Not on file   Highest education level: Not on file  Occupational History   Not on file  Tobacco Use   Smoking status: Never   Smokeless tobacco: Never  Substance and Sexual Activity   Alcohol use: Never   Drug use: Never   Sexual activity: Not on file  Other Topics Concern   Not on file  Social History Narrative   Not on file   Social Determinants of Health   Financial Resource Strain: Not on file  Food Insecurity: Not on file  Transportation Needs: Not on file  Physical Activity: Not on file  Stress: Not on file  Social Connections: Not on file   History reviewed. No pertinent family history. - negative  except otherwise stated in the family history section No Known Allergies Prior to Admission medications   Not on File   CT ANGIO HEAD NECK W WO CM  Result Date: 05/12/2021 CLINICAL DATA:  Transient ischemic attack EXAM: CT ANGIOGRAPHY HEAD AND NECK TECHNIQUE: Multidetector CT imaging of the head and neck was performed using the standard protocol during bolus administration of intravenous contrast. Multiplanar CT image reconstructions and MIPs were obtained to evaluate the vascular anatomy. Carotid stenosis measurements (when applicable) are obtained utilizing NASCET criteria, using the distal internal carotid diameter as the denominator. RADIATION DOSE REDUCTION: This exam was performed according to the departmental dose-optimization program which includes automated exposure control, adjustment of the mA and/or kV according to patient size and/or use of iterative reconstruction technique. CONTRAST:  49mL OMNIPAQUE IOHEXOL 350 MG/ML SOLN COMPARISON:  03/21/2021 head CT 10/22/2019 CTA head neck FINDINGS: CT HEAD FINDINGS Brain: There is no mass, hemorrhage or extra-axial collection. There is generalized atrophy without lobar predilection. Old left basal ganglia small vessel infarct. There is hypoattenuation of the periventricular white matter, most commonly indicating chronic ischemic microangiopathy. Skull: The visualized skull base, calvarium and extracranial soft tissues are normal. Sinuses/Orbits: No fluid levels or advanced mucosal thickening of the visualized paranasal sinuses. No mastoid or middle ear effusion. The orbits are normal. CTA NECK  FINDINGS SKELETON: There is no bony spinal canal stenosis. No lytic or blastic lesion. OTHER NECK: Normal pharynx, larynx and major salivary glands. No cervical lymphadenopathy. Unremarkable thyroid gland. UPPER CHEST: No pneumothorax or pleural effusion. No nodules or masses. AORTIC ARCH: There is calcific atherosclerosis of the aortic arch. There is no aneurysm,  dissection or hemodynamically significant stenosis of the visualized portion of the aorta. Normal variant aortic arch branching pattern with the brachiocephalic and left common carotid arteries sharing a common origin. The visualized proximal subclavian arteries are widely patent. RIGHT CAROTID SYSTEM: No dissection, occlusion or aneurysm. Mild atherosclerotic calcification at the carotid bifurcation without hemodynamically significant stenosis. LEFT CAROTID SYSTEM: No dissection, occlusion or aneurysm. Mild atherosclerotic calcification at the carotid bifurcation without hemodynamically significant stenosis. VERTEBRAL ARTERIES: Left dominant configuration. Both origins are clearly patent. There is no dissection, occlusion or flow-limiting stenosis to the skull base (V1-V3 segments). CTA HEAD FINDINGS POSTERIOR CIRCULATION: --Vertebral arteries: Normal V4 segments. --Inferior cerebellar arteries: Normal. --Basilar artery: Normal. --Superior cerebellar arteries: Normal. --Posterior cerebral arteries (PCA): Severe stenosis of the right P1 segment. There is occlusion of the left P1 segment, but the P2 segment is patent via supply from the left P-comm. ANTERIOR CIRCULATION: --Intracranial internal carotid arteries: Normal. --Anterior cerebral arteries (ACA): Normal. Both A1 segments are present. Patent anterior communicating artery (a-comm). --Middle cerebral arteries (MCA): Normal. VENOUS SINUSES: As permitted by contrast timing, patent. ANATOMIC VARIANTS: None Review of the MIP images confirms the above findings. IMPRESSION: 1. No emergent large vessel occlusion. 2. Unchanged severe stenosis of the right PCA P1 segment. 3. Unchanged occlusion of the left PCA P1 segment, but the P2 segment is patent via supply from the left P-comm. Aortic Atherosclerosis (ICD10-I70.0). Electronically Signed   By: Ulyses Jarred M.D.   On: 05/12/2021 00:07   DG Ankle Complete Left  Result Date: 05/11/2021 CLINICAL DATA:  Level 2  trauma.  Fall. EXAM: LEFT ANKLE COMPLETE - 3+ VIEW COMPARISON:  None. FINDINGS: Large calcaneal heel spur. Minimal chronic enthesopathic change at the Achilles insertion on the calcaneus. Mild medial tibiotalar joint space narrowing. Moderate talonavicular joint space narrowing. No acute fracture or dislocation. Mild-to-moderate lateral malleolar soft tissue swelling. IMPRESSION:: IMPRESSION: 1. Mild-to-moderate lateral malleolar soft tissue swelling. No acute fracture is seen. 2. Large calcaneal heel spur. Electronically Signed   By: Yvonne Kendall M.D.   On: 05/11/2021 21:59   DG Pelvis Portable  Result Date: 05/11/2021 CLINICAL DATA:  [place] EXAM: PORTABLE PELVIS 1-2 VIEWS COMPARISON:  None. FINDINGS: Single frontal view of the pelvis excludes portions of the left iliac crest and proximal right femur by collimation. Bilateral hip arthroplasties are in the expected position without signs of acute complication. No acute displaced fractures. Soft tissues are unremarkable. IMPRESSION: 1. Unremarkable pelvis and bilateral hips. Electronically Signed   By: Randa Ngo M.D.   On: 05/11/2021 22:09   DG Chest Port 1 View  Result Date: 05/11/2021 CLINICAL DATA:  Level 2 trauma, fell EXAM: PORTABLE CHEST 1 VIEW COMPARISON:  03/20/2013 FINDINGS: Single frontal view of the chest demonstrates stable dual lead pacer. The cardiac silhouette is unremarkable. No acute airspace disease, effusion, or pneumothorax. No acute displaced fractures. Stable posttraumatic or postsurgical changes of the right fourth rib. IMPRESSION: 1. No acute intrathoracic process. Electronically Signed   By: Randa Ngo M.D.   On: 05/11/2021 22:06   DG FEMUR PORT, 1V RIGHT  Result Date: 05/11/2021 CLINICAL DATA:  [place] EXAM: RIGHT FEMUR PORTABLE 1 VIEW COMPARISON:  None.  FINDINGS: Two frontal views of the right femur are obtained. There is a comminuted displaced fracture involving the mid femoral diaphysis. The fracture extends along the  posterior margin of the distal femur, to the level of the femoral component of the right knee arthroplasty. There is external rotation of the distal fracture fragments. Orthopedic hardware from the right hip and right knee arthroplasties appear unremarkable. IMPRESSION: 1. Comminuted periprosthetic fracture of the mid and distal right femur as above. External rotation of the distal fracture fragments. Electronically Signed   By: Randa Ngo M.D.   On: 05/11/2021 22:09     Positive ROS: All other systems have been reviewed and were otherwise negative with the exception of those mentioned in the HPI and as above.  Physical Exam: General: No acute distress Cardiovascular: No pedal edema Respiratory: No cyanosis, no use of accessory musculature GI: No organomegaly, abdomen is soft and non-tender Skin: No lesions in the area of chief complaint Neurologic: Sensation intact distally Psychiatric: Patient is at baseline mood and affect Lymphatic: No axillary or cervical lymphadenopathy  MUSCULOSKELETAL:  Right femoral shaft fracture with deformity.  He is able to extend and flex at all of his right toes.  He does have a 2+ dorsalis pedis pulse.  Toes are all warm and well-perfused.  No other obvious long bone or joint injuries or deformities  Independent Imaging Review: X-ray right femur, right ankle, right knee: There is a midshaft right femoral fracture in the setting of previous right total hip arthroplasty and right total knee arthroplasty all of which appear to be well fixed  Assessment: 87 year old male with right femoral shaft fracture with significant angulation.  On examination he does appear to have an intact vascular exam.  He was placed in Buck's traction overnight for additional traction on the long bone.  Given his severe angulation and deformity I would certainly recommend surgical intervention.  Given the fact that he does have a hip as well as knee arthroplasty I believe femoral  plating would be the best technique in order to restore length and alignment.  I discussed that I would like to do this today given the nature of the long bone injury.  We will plan for surgery pending medical work-up and optimization.  Plan: Plan for right femoral shaft open reduction internal fixation    After a lengthy discussion of treatment options, including risks, benefits, alternatives, complications of surgical and nonsurgical conservative options, the patient elected surgical repair.   The patient  is aware of the material risks  and complications including, but not limited to injury to adjacent structures, neurovascular injury, infection, numbness, bleeding, implant failure, thermal burns, stiffness, persistent pain, failure to heal, disease transmission from allograft, need for further surgery, dislocation, anesthetic risks, blood clots, risks of death,and others. The probabilities of surgical success and failure discussed with patient given their particular co-morbidities.The time and nature of expected rehabilitation and recovery was discussed.The patient's questions were all answered preoperatively.  No barriers to understanding were noted. I explained the natural history of the disease process and Rx rationale.  I explained to the patient what I considered to be reasonable expectations given their personal situation.  The final treatment plan was arrived at through a shared patient decision making process model.   Thank you for the consult and the opportunity to see Mr. Jene Gutherie, MD [facility] 7:47 AM

## 2021-05-12 NOTE — ED Notes (Signed)
Pt was inquiring about speaking with the surgeon. RN informed pt he would probably speak with surgeon once it is time to go to OR. Consent for is filled out at the bedside and pt will sign once he has spoke the surgeon. ?

## 2021-05-12 NOTE — ED Notes (Signed)
Family would like to talk with ortho prior to signing consent form for ORIF. Will notify ortho.  ?

## 2021-05-12 NOTE — Brief Op Note (Signed)
? ?  Brief Op Note ? ?Date of Surgery: ?05/12/2021 ? ?Preoperative Diagnosis: ?rigth femur fracture ? ?Postoperative Diagnosis: ?same ? ?Procedure: ?Procedure(s): ?OPEN REDUCTION INTERNAL FIXATION (ORIF) DISTAL FEMUR FRACTURE ? ?Implants: ?Implant Name Type Inv. Item Serial No. Manufacturer Lot No. LRB No. Used Action  ?PLATE RT DISTAL FEMUR NCB 18H - [id number] Plate PLATE RT DISTAL FEMUR NCB 18H [id number] [employer](ORTH,TRAU,BIO,SG) [id number] Right 1 Implanted  ?SCREW NCB 5.0X85MM - [id number] Screw SCREW NCB 5.0X85MM [id number] [employer](ORTH,TRAU,BIO,SG) [id number] Right 1 Implanted  ?SCREW NCB 5.0X38 - [id number] Screw SCREW NCB 5.0X38 [id number] [employer](ORTH,TRAU,BIO,SG) [id number] Right 1 Implanted  ?SCREW CORTICAL NCB 5.0X40 - [id number] Screw SCREW CORTICAL NCB 5.0X40 [id number] [employer](ORTH,TRAU,BIO,SG) [id number] Right 2 Implanted  ?SCREW CORT NCB SELFTAP 5.0X42 - [id number] Screw SCREW CORT NCB SELFTAP 5.0X42 [id number] [employer](ORTH,TRAU,BIO,SG) [id number] Right 1 Implanted  ?SCREW 5.0 - [id number] Screw SCREW 5.0 [id number] [employer](ORTH,TRAU,BIO,SG) [id number] Right 1 Implanted  ?SCREW NCB 5.0X55MM - [id number] Screw SCREW NCB 5.0X55MM [id number] [employer](ORTH,TRAU,BIO,SG) [id number] Right 1 Implanted  ?SCREW 5.0 - [id number] Screw SCREW 5.0 [id number] [employer](ORTH,TRAU,BIO,SG) [id number] Right 1 Implanted  ?SCREW NCB 5.0X36MM - [id number] Screw SCREW NCB 5.0X36MM [id number] [employer](ORTH,TRAU,BIO,SG) [id number] Right 2 Implanted  ? ? ?Surgeons: ?Surgeon(s): ?Huel Cote, MD ? ?Anesthesia: ?General ? ? ? ?Estimated Blood Loss: ?See anesthesia record ? ?Complications: ?None ? ?Condition to PACU: ?Stable ? ?Benancio Deeds, MD ?05/12/2021 ?9:08 PM  ?

## 2021-05-12 NOTE — Discharge Instructions (Addendum)
? ? ?   Discharge Instructions  ? ? ?Attending Surgeon: Huel Cote, MD ?Office Phone Number: [phone] ? ? ?Diagnosis and Procedures:   ? ?Surgeries Performed: ?Right femoral fixation ? ?Discharge Plan:  ? ? ?Activity:  ?Non-weightbearing, utilizing crutches or walker, until seen at postoperative Physical Therapy visit this week.  ?

## 2021-05-12 NOTE — Anesthesia Postprocedure Evaluation (Signed)
Anesthesia Post Note ? ?Patient: Marcus Mcgee ? ?Procedure(s) Performed: OPEN REDUCTION INTERNAL FIXATION (ORIF) DISTAL FEMUR FRACTURE (Right) ? ?  ? ?Patient location during evaluation: PACU ?Anesthesia Type: General ?Level of consciousness: awake and alert ?Pain management: pain level controlled ?Vital Signs Assessment: post-procedure vital signs reviewed and stable ?Respiratory status: spontaneous breathing, nonlabored ventilation and respiratory function stable ?Cardiovascular status: stable and blood pressure returned to baseline ?Anesthetic complications: no ? ? ?No notable events documented. ? ?Last Vitals:  ?Vitals:  ? 05/12/21 2145 05/12/21 2200  ?BP: 125/78 123/70  ?Pulse: 88 85  ?Resp: 11 14  ?Temp:  (!) 36.1 ?C  ?SpO2: 95% 94%  ?  ?Last Pain:  ?Vitals:  ? 05/12/21 2200  ?TempSrc:   ?PainSc: 0-No pain  ? ? ?  ?  ?  ?  ?  ?  ? ?Beryle Lathe ? ? ? ? ?

## 2021-05-12 NOTE — H&P (View-Only) (Signed)
ORTHOPAEDIC CONSULTATION  REQUESTING PHYSICIAN: Charlynne Cousins, MD  Chief Complaint: Right femoral shaft fracture  HPI: Marcus Mcgee is a 87 y.o. male who presents with a right femoral shaft fracture after a fall while standing.  He was getting up to walk with his wife and subsequently fell with an obvious deformity of the right femur.  He is currently being worked up for stroke in the setting of word finding difficulties which are more chronic in nature.  He presented to the emergency room was found to have a femoral shaft fracture.  Of note he does have a history of right hip and knee arthroplasty which per chart review were done many years prior.  On further asking him he states that these were both done proximately 6 months ago which appears to be an accurate.  He lives at home with his wife.  He does occasionally use a cane per his report  Past Medical History:  Diagnosis Date   Arthritis    COPD (chronic obstructive pulmonary disease) (HCC)    Hypertension    Stroke ([place])    Past Surgical History:  Procedure Laterality Date   REPLACEMENT TOTAL KNEE Bilateral    TOTAL HIP ARTHROPLASTY Bilateral    Social History   Socioeconomic History   Marital status: Married    Spouse name: Not on file   Number of children: Not on file   Years of education: Not on file   Highest education level: Not on file  Occupational History   Not on file  Tobacco Use   Smoking status: Never   Smokeless tobacco: Never  Substance and Sexual Activity   Alcohol use: Never   Drug use: Never   Sexual activity: Not on file  Other Topics Concern   Not on file  Social History Narrative   Not on file   Social Determinants of Health   Financial Resource Strain: Not on file  Food Insecurity: Not on file  Transportation Needs: Not on file  Physical Activity: Not on file  Stress: Not on file  Social Connections: Not on file   History reviewed. No pertinent family history. - negative  except otherwise stated in the family history section No Known Allergies Prior to Admission medications   Not on File   CT ANGIO HEAD NECK W WO CM  Result Date: 05/12/2021 CLINICAL DATA:  Transient ischemic attack EXAM: CT ANGIOGRAPHY HEAD AND NECK TECHNIQUE: Multidetector CT imaging of the head and neck was performed using the standard protocol during bolus administration of intravenous contrast. Multiplanar CT image reconstructions and MIPs were obtained to evaluate the vascular anatomy. Carotid stenosis measurements (when applicable) are obtained utilizing NASCET criteria, using the distal internal carotid diameter as the denominator. RADIATION DOSE REDUCTION: This exam was performed according to the departmental dose-optimization program which includes automated exposure control, adjustment of the mA and/or kV according to patient size and/or use of iterative reconstruction technique. CONTRAST:  5mL OMNIPAQUE IOHEXOL 350 MG/ML SOLN COMPARISON:  03/21/2021 head CT 10/22/2019 CTA head neck FINDINGS: CT HEAD FINDINGS Brain: There is no mass, hemorrhage or extra-axial collection. There is generalized atrophy without lobar predilection. Old left basal ganglia small vessel infarct. There is hypoattenuation of the periventricular white matter, most commonly indicating chronic ischemic microangiopathy. Skull: The visualized skull base, calvarium and extracranial soft tissues are normal. Sinuses/Orbits: No fluid levels or advanced mucosal thickening of the visualized paranasal sinuses. No mastoid or middle ear effusion. The orbits are normal. CTA NECK  FINDINGS SKELETON: There is no bony spinal canal stenosis. No lytic or blastic lesion. OTHER NECK: Normal pharynx, larynx and major salivary glands. No cervical lymphadenopathy. Unremarkable thyroid gland. UPPER CHEST: No pneumothorax or pleural effusion. No nodules or masses. AORTIC ARCH: There is calcific atherosclerosis of the aortic arch. There is no aneurysm,  dissection or hemodynamically significant stenosis of the visualized portion of the aorta. Normal variant aortic arch branching pattern with the brachiocephalic and left common carotid arteries sharing a common origin. The visualized proximal subclavian arteries are widely patent. RIGHT CAROTID SYSTEM: No dissection, occlusion or aneurysm. Mild atherosclerotic calcification at the carotid bifurcation without hemodynamically significant stenosis. LEFT CAROTID SYSTEM: No dissection, occlusion or aneurysm. Mild atherosclerotic calcification at the carotid bifurcation without hemodynamically significant stenosis. VERTEBRAL ARTERIES: Left dominant configuration. Both origins are clearly patent. There is no dissection, occlusion or flow-limiting stenosis to the skull base (V1-V3 segments). CTA HEAD FINDINGS POSTERIOR CIRCULATION: --Vertebral arteries: Normal V4 segments. --Inferior cerebellar arteries: Normal. --Basilar artery: Normal. --Superior cerebellar arteries: Normal. --Posterior cerebral arteries (PCA): Severe stenosis of the right P1 segment. There is occlusion of the left P1 segment, but the P2 segment is patent via supply from the left P-comm. ANTERIOR CIRCULATION: --Intracranial internal carotid arteries: Normal. --Anterior cerebral arteries (ACA): Normal. Both A1 segments are present. Patent anterior communicating artery (a-comm). --Middle cerebral arteries (MCA): Normal. VENOUS SINUSES: As permitted by contrast timing, patent. ANATOMIC VARIANTS: None Review of the MIP images confirms the above findings. IMPRESSION: 1. No emergent large vessel occlusion. 2. Unchanged severe stenosis of the right PCA P1 segment. 3. Unchanged occlusion of the left PCA P1 segment, but the P2 segment is patent via supply from the left P-comm. Aortic Atherosclerosis (ICD10-I70.0). Electronically Signed   By: Ulyses Jarred M.D.   On: 05/12/2021 00:07   DG Ankle Complete Left  Result Date: 05/11/2021 CLINICAL DATA:  Level 2  trauma.  Fall. EXAM: LEFT ANKLE COMPLETE - 3+ VIEW COMPARISON:  None. FINDINGS: Large calcaneal heel spur. Minimal chronic enthesopathic change at the Achilles insertion on the calcaneus. Mild medial tibiotalar joint space narrowing. Moderate talonavicular joint space narrowing. No acute fracture or dislocation. Mild-to-moderate lateral malleolar soft tissue swelling. IMPRESSION:: IMPRESSION: 1. Mild-to-moderate lateral malleolar soft tissue swelling. No acute fracture is seen. 2. Large calcaneal heel spur. Electronically Signed   By: Yvonne Kendall M.D.   On: 05/11/2021 21:59   DG Pelvis Portable  Result Date: 05/11/2021 CLINICAL DATA:  [place] EXAM: PORTABLE PELVIS 1-2 VIEWS COMPARISON:  None. FINDINGS: Single frontal view of the pelvis excludes portions of the left iliac crest and proximal right femur by collimation. Bilateral hip arthroplasties are in the expected position without signs of acute complication. No acute displaced fractures. Soft tissues are unremarkable. IMPRESSION: 1. Unremarkable pelvis and bilateral hips. Electronically Signed   By: Randa Ngo M.D.   On: 05/11/2021 22:09   DG Chest Port 1 View  Result Date: 05/11/2021 CLINICAL DATA:  Level 2 trauma, fell EXAM: PORTABLE CHEST 1 VIEW COMPARISON:  03/20/2013 FINDINGS: Single frontal view of the chest demonstrates stable dual lead pacer. The cardiac silhouette is unremarkable. No acute airspace disease, effusion, or pneumothorax. No acute displaced fractures. Stable posttraumatic or postsurgical changes of the right fourth rib. IMPRESSION: 1. No acute intrathoracic process. Electronically Signed   By: Randa Ngo M.D.   On: 05/11/2021 22:06   DG FEMUR PORT, 1V RIGHT  Result Date: 05/11/2021 CLINICAL DATA:  [place] EXAM: RIGHT FEMUR PORTABLE 1 VIEW COMPARISON:  None.  FINDINGS: Two frontal views of the right femur are obtained. There is a comminuted displaced fracture involving the mid femoral diaphysis. The fracture extends along the  posterior margin of the distal femur, to the level of the femoral component of the right knee arthroplasty. There is external rotation of the distal fracture fragments. Orthopedic hardware from the right hip and right knee arthroplasties appear unremarkable. IMPRESSION: 1. Comminuted periprosthetic fracture of the mid and distal right femur as above. External rotation of the distal fracture fragments. Electronically Signed   By: Randa Ngo M.D.   On: 05/11/2021 22:09     Positive ROS: All other systems have been reviewed and were otherwise negative with the exception of those mentioned in the HPI and as above.  Physical Exam: General: No acute distress Cardiovascular: No pedal edema Respiratory: No cyanosis, no use of accessory musculature GI: No organomegaly, abdomen is soft and non-tender Skin: No lesions in the area of chief complaint Neurologic: Sensation intact distally Psychiatric: Patient is at baseline mood and affect Lymphatic: No axillary or cervical lymphadenopathy  MUSCULOSKELETAL:  Right femoral shaft fracture with deformity.  He is able to extend and flex at all of his right toes.  He does have a 2+ dorsalis pedis pulse.  Toes are all warm and well-perfused.  No other obvious long bone or joint injuries or deformities  Independent Imaging Review: X-ray right femur, right ankle, right knee: There is a midshaft right femoral fracture in the setting of previous right total hip arthroplasty and right total knee arthroplasty all of which appear to be well fixed  Assessment: 87 year old male with right femoral shaft fracture with significant angulation.  On examination he does appear to have an intact vascular exam.  He was placed in Buck's traction overnight for additional traction on the long bone.  Given his severe angulation and deformity I would certainly recommend surgical intervention.  Given the fact that he does have a hip as well as knee arthroplasty I believe femoral  plating would be the best technique in order to restore length and alignment.  I discussed that I would like to do this today given the nature of the long bone injury.  We will plan for surgery pending medical work-up and optimization.  Plan: Plan for right femoral shaft open reduction internal fixation    After a lengthy discussion of treatment options, including risks, benefits, alternatives, complications of surgical and nonsurgical conservative options, the patient elected surgical repair.   The patient  is aware of the material risks  and complications including, but not limited to injury to adjacent structures, neurovascular injury, infection, numbness, bleeding, implant failure, thermal burns, stiffness, persistent pain, failure to heal, disease transmission from allograft, need for further surgery, dislocation, anesthetic risks, blood clots, risks of death,and others. The probabilities of surgical success and failure discussed with patient given their particular co-morbidities.The time and nature of expected rehabilitation and recovery was discussed.The patient's questions were all answered preoperatively.  No barriers to understanding were noted. I explained the natural history of the disease process and Rx rationale.  I explained to the patient what I considered to be reasonable expectations given their personal situation.  The final treatment plan was arrived at through a shared patient decision making process model.   Thank you for the consult and the opportunity to see Mr. Marcus Muffler, MD [facility] 7:47 AM

## 2021-05-12 NOTE — Anesthesia Procedure Notes (Signed)
Procedure Name: Intubation ?Date/Time: 05/12/2021 6:08 PM ?Performed by: Eligha Bridegroom, CRNA ?Pre-anesthesia Checklist: Patient identified, Emergency Drugs available, Suction available and Patient being monitored ?Patient Re-evaluated:Patient Re-evaluated prior to induction ?Oxygen Delivery Method: Circle system utilized ?Preoxygenation: Pre-oxygenation with 100% oxygen ?Induction Type: IV induction ?Ventilation: Mask ventilation without difficulty ?Laryngoscope Size: Mac and 4 ?Grade View: Grade II ?Tube type: Oral ?Tube size: 7.5 mm ?Number of attempts: 1 ?Airway Equipment and Method: Stylet ?Placement Confirmation: ETT inserted through vocal cords under direct vision, positive ETCO2 and breath sounds checked- equal and bilateral ?Secured at: 22 cm ?Tube secured with: Tape ?Dental Injury: Teeth and Oropharynx as per pre-operative assessment  ? ? ? ? ?

## 2021-05-12 NOTE — Progress Notes (Addendum)
[facility] PROGRESS NOTE    Progress Note  Marcus Mcgee  [id number] DOB: 08-14-1934 DOA: 05/11/2021 PCP: Dineen Kid, MD     Brief Narrative:   DAIMION Mcgee is an 87 y.o. male past medical history significant for essential hypertension, pacemaker with history arrhythmia, history of CVA who presents by EMS after a fall after this he noticed deformity of his right thigh EMS was called according to his wife he has been having difficulty with balance and has had couple of falls with no injuries.  He saw neurologist the day prior to admission as he was having memory difficulties.  Family relates he has been falling for the last several months. Orthopedic surgery was consulted    Assessment/Plan:   Closed fracture of right femur, unspecified fracture morphology, initial encounter [facility]): Orthopedic surgery has been consulted Continue narcotics and anticoagulation per orthopedics.  Start him on MiraLAX. He was placed on Buck traction by Ortho continue IV fluids. The patient agreed to proceed with right femoral shaft open reduction internal fixation. He has nonfocal on physical exam, moving all 4 extremities without any difficulties awake alert and oriented x3.  Able to carry on a conversation. I did explain the risk and benefits to family and patient with proceeding with surgery and they would like to proceed, knowing that he has less than 1% chance of of heart attack cardiopulmonary complications complications or even stroke he would like to proceed with surgical intervention.  Acute kidney injury: Likely prerenal azotemia, with a baseline creatinine of around 1 on admission 1.6, started on IV fluids recheck tomorrow morning.  Essential hypertension: Blood pressure is stable, pharmacy to do med rec. Continue Jenise IV as needed for systolic blood pressure greater than 180/110.  COPD: Stable, continue inhalers.  Mild hyponatremia Likely hypovolemic continue IV fluids  recheck in the morning.  Prolonged QTc: Noted.  History of CVA: CT of the head and neck show no large vessel occlusion unchanged severe stenosis of the right PCA P1 segment and unchanged also on the left 2D echo pending. Ortho to dictate when to start back aspirin.  History of nonobstructive coronary artery disease status post cath in May 2010: Med rec to be done by pharmacy.  History of pacemaker Noted    DVT prophylaxis: lovenox Family Communication:daughter  Status is: Inpatient Remains inpatient appropriate because: Acute closed hip fracture      Code Status:     Code Status Orders  (From admission, onward)           Start     Ordered   05/11/21 2322  Full code  Continuous        05/11/21 2321           Code Status History     This patient has a current code status but no historical code status.      Advance Directive Documentation    Marcus Mcgee Most Recent Value  Type of Advance Directive Living will  Pre-existing out of facility DNR order (yellow form or pink MOST form) --  "MOST" Form in Place? --         IV Access:   Peripheral IV   Procedures and diagnostic studies:   CT ANGIO HEAD NECK W WO CM  Result Date: 05/12/2021 CLINICAL DATA:  Transient ischemic attack EXAM: CT ANGIOGRAPHY HEAD AND NECK TECHNIQUE: Multidetector CT imaging of the head and neck was performed using the standard protocol during bolus administration of intravenous contrast. Multiplanar CT  image reconstructions and MIPs were obtained to evaluate the vascular anatomy. Carotid stenosis measurements (when applicable) are obtained utilizing NASCET criteria, using the distal internal carotid diameter as the denominator. RADIATION DOSE REDUCTION: This exam was performed according to the departmental dose-optimization program which includes automated exposure control, adjustment of the mA and/or kV according to patient size and/or use of iterative reconstruction  technique. CONTRAST:  17mL OMNIPAQUE IOHEXOL 350 MG/ML SOLN COMPARISON:  03/21/2021 head CT 10/22/2019 CTA head neck FINDINGS: CT HEAD FINDINGS Brain: There is no mass, hemorrhage or extra-axial collection. There is generalized atrophy without lobar predilection. Old left basal ganglia small vessel infarct. There is hypoattenuation of the periventricular white matter, most commonly indicating chronic ischemic microangiopathy. Skull: The visualized skull base, calvarium and extracranial soft tissues are normal. Sinuses/Orbits: No fluid levels or advanced mucosal thickening of the visualized paranasal sinuses. No mastoid or middle ear effusion. The orbits are normal. CTA NECK FINDINGS SKELETON: There is no bony spinal canal stenosis. No lytic or blastic lesion. OTHER NECK: Normal pharynx, larynx and major salivary glands. No cervical lymphadenopathy. Unremarkable thyroid gland. UPPER CHEST: No pneumothorax or pleural effusion. No nodules or masses. AORTIC ARCH: There is calcific atherosclerosis of the aortic arch. There is no aneurysm, dissection or hemodynamically significant stenosis of the visualized portion of the aorta. Normal variant aortic arch branching pattern with the brachiocephalic and left common carotid arteries sharing a common origin. The visualized proximal subclavian arteries are widely patent. RIGHT CAROTID SYSTEM: No dissection, occlusion or aneurysm. Mild atherosclerotic calcification at the carotid bifurcation without hemodynamically significant stenosis. LEFT CAROTID SYSTEM: No dissection, occlusion or aneurysm. Mild atherosclerotic calcification at the carotid bifurcation without hemodynamically significant stenosis. VERTEBRAL ARTERIES: Left dominant configuration. Both origins are clearly patent. There is no dissection, occlusion or flow-limiting stenosis to the skull base (V1-V3 segments). CTA HEAD FINDINGS POSTERIOR CIRCULATION: --Vertebral arteries: Normal V4 segments. --Inferior cerebellar  arteries: Normal. --Basilar artery: Normal. --Superior cerebellar arteries: Normal. --Posterior cerebral arteries (PCA): Severe stenosis of the right P1 segment. There is occlusion of the left P1 segment, but the P2 segment is patent via supply from the left P-comm. ANTERIOR CIRCULATION: --Intracranial internal carotid arteries: Normal. --Anterior cerebral arteries (ACA): Normal. Both A1 segments are present. Patent anterior communicating artery (a-comm). --Middle cerebral arteries (MCA): Normal. VENOUS SINUSES: As permitted by contrast timing, patent. ANATOMIC VARIANTS: None Review of the MIP images confirms the above findings. IMPRESSION: 1. No emergent large vessel occlusion. 2. Unchanged severe stenosis of the right PCA P1 segment. 3. Unchanged occlusion of the left PCA P1 segment, but the P2 segment is patent via supply from the left P-comm. Aortic Atherosclerosis (ICD10-I70.0). Electronically Signed   By: Ulyses Jarred M.D.   On: 05/12/2021 00:07   DG Ankle Complete Left  Result Date: 05/11/2021 CLINICAL DATA:  Level 2 trauma.  Fall. EXAM: LEFT ANKLE COMPLETE - 3+ VIEW COMPARISON:  None. FINDINGS: Large calcaneal heel spur. Minimal chronic enthesopathic change at the Achilles insertion on the calcaneus. Mild medial tibiotalar joint space narrowing. Moderate talonavicular joint space narrowing. No acute fracture or dislocation. Mild-to-moderate lateral malleolar soft tissue swelling. IMPRESSION:: IMPRESSION: 1. Mild-to-moderate lateral malleolar soft tissue swelling. No acute fracture is seen. 2. Large calcaneal heel spur. Electronically Signed   By: Yvonne Kendall M.D.   On: 05/11/2021 21:59   DG Pelvis Portable  Result Date: 05/11/2021 CLINICAL DATA:  [place] EXAM: PORTABLE PELVIS 1-2 VIEWS COMPARISON:  None. FINDINGS: Single frontal view of the pelvis excludes portions of the  left iliac crest and proximal right femur by collimation. Bilateral hip arthroplasties are in the expected position without signs  of acute complication. No acute displaced fractures. Soft tissues are unremarkable. IMPRESSION: 1. Unremarkable pelvis and bilateral hips. Electronically Signed   By: Randa Ngo M.D.   On: 05/11/2021 22:09   DG Chest Port 1 View  Result Date: 05/11/2021 CLINICAL DATA:  Level 2 trauma, fell EXAM: PORTABLE CHEST 1 VIEW COMPARISON:  03/20/2013 FINDINGS: Single frontal view of the chest demonstrates stable dual lead pacer. The cardiac silhouette is unremarkable. No acute airspace disease, effusion, or pneumothorax. No acute displaced fractures. Stable posttraumatic or postsurgical changes of the right fourth rib. IMPRESSION: 1. No acute intrathoracic process. Electronically Signed   By: Randa Ngo M.D.   On: 05/11/2021 22:06   DG FEMUR PORT, 1V RIGHT  Result Date: 05/11/2021 CLINICAL DATA:  [place] EXAM: RIGHT FEMUR PORTABLE 1 VIEW COMPARISON:  None. FINDINGS: Two frontal views of the right femur are obtained. There is a comminuted displaced fracture involving the mid femoral diaphysis. The fracture extends along the posterior margin of the distal femur, to the level of the femoral component of the right knee arthroplasty. There is external rotation of the distal fracture fragments. Orthopedic hardware from the right hip and right knee arthroplasties appear unremarkable. IMPRESSION: 1. Comminuted periprosthetic fracture of the mid and distal right femur as above. External rotation of the distal fracture fragments. Electronically Signed   By: Randa Ngo M.D.   On: 05/11/2021 22:09     Medical Consultants:   None.   Subjective:    Ulyess Mort he relates his pain is controlled but every now and then he gets spasms.  Objective:    Vitals:   05/12/21 0500 05/12/21 0515 05/12/21 0606 05/12/21 0630  BP: 113/82 108/67  121/85  Pulse: 89 76  77  Resp: 14 20  (!) 22  Temp:   (!) 100.6 F (38.1 C)   TempSrc:   Axillary   SpO2: 99% 100%  100%  Weight:      Height:       SpO2: 100  %  No intake or output data in the 24 hours ending 05/12/21 0801 Filed Weights   05/11/21 2152  Weight: 90.7 kg    Exam: General exam: In no acute distress. Respiratory system: Good air movement and clear to auscultation. Cardiovascular system: S1 & S2 heard, RRR. No JVD. Gastrointestinal system: Abdomen is nondistended, soft and nontender.  Extremities: No pedal edema. Skin: No rashes, lesions or ulcers Psychiatry: Judgement and insight appear normal. Mood & affect appropriate.    Data Reviewed:    Labs: Basic Metabolic Panel: Recent Labs  Lab 05/11/21 0020 05/11/21 2140 05/12/21 0544  NA 134* 133* 133*  K 5.0 4.8 5.1  CL 98 100 100  CO2 25  --  24  GLUCOSE 127* 128* 142*  BUN 18 20 23   CREATININE 1.25* 1.10 1.46*  CALCIUM 9.0  --  8.9   GFR Estimated Creatinine Clearance: 45 mL/min (A) (by C-G formula based on SCr of 1.46 mg/dL (H)). Liver Function Tests: Recent Labs  Lab 05/11/21 0020  AST 27  ALT 16  ALKPHOS 59  BILITOT 0.3  PROT 5.7*  ALBUMIN 3.6   No results for input(s): LIPASE, AMYLASE in the last 168 hours. No results for input(s): AMMONIA in the last 168 hours. Coagulation profile Recent Labs  Lab 05/11/21 2132  INR 1.0   COVID-19 Labs  No results  for input(s): DDIMER, FERRITIN, LDH, CRP in the last 72 hours.  Lab Results  Component Value Date   SARSCOV2NAA NEGATIVE 05/11/2021    CBC: Recent Labs  Lab 05/11/21 0020 05/11/21 2140 05/12/21 0544  WBC 9.1  --  12.1*  HGB 13.2 13.6 10.5*  HCT 39.4 40.0 29.8*  MCV 91.0  --  86.4  PLT 202  --  228   Cardiac Enzymes: No results for input(s): CKTOTAL, CKMB, CKMBINDEX, TROPONINI in the last 168 hours. BNP (last 3 results) No results for input(s): PROBNP in the last 8760 hours. CBG: No results for input(s): GLUCAP in the last 168 hours. D-Dimer: No results for input(s): DDIMER in the last 72 hours. Hgb A1c: No results for input(s): HGBA1C in the last 72 hours. Lipid Profile: No  results for input(s): CHOL, HDL, LDLCALC, TRIG, CHOLHDL, LDLDIRECT in the last 72 hours. Thyroid function studies: No results for input(s): TSH, T4TOTAL, T3FREE, THYROIDAB in the last 72 hours.  Invalid input(s): FREET3 Anemia work up: No results for input(s): VITAMINB12, FOLATE, FERRITIN, TIBC, IRON, RETICCTPCT in the last 72 hours. Sepsis Labs: Recent Labs  Lab 05/11/21 0020 05/11/21 2132 05/12/21 0544  WBC 9.1  --  12.1*  LATICACIDVEN  --  3.7*  --    Microbiology Recent Results (from the past 240 hour(s))  Resp Panel by RT-PCR (Flu A&B, Covid) Nasopharyngeal Swab     Status: None   Collection Time: 05/11/21  9:32 PM   Specimen: Nasopharyngeal Swab; Nasopharyngeal(NP) swabs in vial transport medium  Result Value Ref Range Status   SARS Coronavirus 2 by RT PCR NEGATIVE NEGATIVE Final    Comment: (NOTE) SARS-CoV-2 target nucleic acids are NOT DETECTED.  The SARS-CoV-2 RNA is generally detectable in upper respiratory specimens during the acute phase of infection. The lowest concentration of SARS-CoV-2 viral copies this assay can detect is 138 copies/mL. A negative result does not preclude SARS-Cov-2 infection and should not be used as the sole basis for treatment or other patient management decisions. A negative result may occur with  improper specimen collection/handling, submission of specimen other than nasopharyngeal swab, presence of viral mutation(s) within the areas targeted by this assay, and inadequate number of viral copies(<138 copies/mL). A negative result must be combined with clinical observations, patient history, and epidemiological information. The expected result is Negative.  Fact Sheet for Patients:  BloggerCourse.com  Fact Sheet for Healthcare Providers:  SeriousBroker.it  This test is no t yet approved or cleared by the [place] FDA and  has been authorized for detection and/or diagnosis of  SARS-CoV-2 by FDA under an Emergency Use Authorization (EUA). This EUA will remain  in effect (meaning this test can be used) for the duration of the COVID-19 declaration under Section 564(b)(1) of the Act, 21 U.S.C.section 360bbb-3(b)(1), unless the authorization is terminated  or revoked sooner.       Influenza A by PCR NEGATIVE NEGATIVE Final   Influenza B by PCR NEGATIVE NEGATIVE Final    Comment: (NOTE) The Xpert Xpress SARS-CoV-2/FLU/RSV plus assay is intended as an aid in the diagnosis of influenza from Nasopharyngeal swab specimens and should not be used as a sole basis for treatment. Nasal washings and aspirates are unacceptable for Xpert Xpress SARS-CoV-2/FLU/RSV testing.  Fact Sheet for Patients: BloggerCourse.com  Fact Sheet for Healthcare Providers: SeriousBroker.it  This test is not yet approved or cleared by the [place] FDA and has been authorized for detection and/or diagnosis of SARS-CoV-2 by FDA under an Emergency Use  Authorization (EUA). This EUA will remain in effect (meaning this test can be used) for the duration of the COVID-19 declaration under Section 564(b)(1) of the Act, 21 U.S.C. section 360bbb-3(b)(1), unless the authorization is terminated or revoked.  Performed at [facility], [place] [address], [place], [place] [address]      Medications:    Continuous Infusions:  lactated ringers 100 mL/hr at 05/12/21 0544      LOS: 1 day   Charlynne Cousins  [facility]  05/12/2021, 8:01 AM

## 2021-05-12 NOTE — Interval H&P Note (Signed)
History and Physical Interval Note: ? ?05/12/2021 ?4:26 PM ? ?Marcus Mcgee  has presented today for surgery, with the diagnosis of rigth femur fracture.  The various methods of treatment have been discussed with the patient and family. After consideration of risks, benefits and other options for treatment, the patient has consented to  Procedure(s): ?OPEN REDUCTION INTERNAL FIXATION (ORIF) DISTAL FEMUR FRACTURE (Right) as a surgical intervention.  The patient's history has been reviewed, patient examined, no change in status, stable for surgery.  I have reviewed the patient's chart and labs.  Questions were answered to the patient's satisfaction.   ? ? ?Marcus Mcgee ? ? ?

## 2021-05-12 NOTE — ED Notes (Signed)
Family notified NT that patient takes Trazodone nightly and would need it to sleep if unable to.  ?

## 2021-05-12 NOTE — Transfer of Care (Signed)
Immediate Anesthesia Transfer of Care Note ? ?Patient: Marcus Mcgee ? ?Procedure(s) Performed: OPEN REDUCTION INTERNAL FIXATION (ORIF) DISTAL FEMUR FRACTURE (Right) ? ?Patient Location: PACU ? ?Anesthesia Type:General ? ?Level of Consciousness: sedated and responds to stimulation ? ?Airway & Oxygen Therapy: Patient Spontanous Breathing and Patient connected to nasal cannula oxygen ? ?Post-op Assessment: Report given to RN, Post -op Vital signs reviewed and stable and Patient moving all extremities X 4 ? ?Post vital signs: Reviewed and stable ? ?Last Vitals:  ?Vitals Value Taken Time  ?BP 136/79 05/12/21 2116  ?Temp    ?Pulse 79 05/12/21 2118  ?Resp 16 05/12/21 2118  ?SpO2 100 % 05/12/21 2118  ?Vitals shown include unvalidated device data. ? ?Last Pain:  ?Vitals:  ? 05/12/21 1709  ?TempSrc:   ?PainSc: 5   ?   ? ?  ? ?Complications: No notable events documented. ?

## 2021-05-13 ENCOUNTER — Encounter (HOSPITAL_COMMUNITY): Payer: Self-pay | Admitting: Family Medicine

## 2021-05-13 DIAGNOSIS — R9431 Abnormal electrocardiogram [ECG] [EKG]: Secondary | ICD-10-CM

## 2021-05-13 DIAGNOSIS — S72351A Displaced comminuted fracture of shaft of right femur, initial encounter for closed fracture: Secondary | ICD-10-CM

## 2021-05-13 DIAGNOSIS — S7291XA Unspecified fracture of right femur, initial encounter for closed fracture: Secondary | ICD-10-CM | POA: Diagnosis not present

## 2021-05-13 DIAGNOSIS — I10 Essential (primary) hypertension: Secondary | ICD-10-CM | POA: Diagnosis not present

## 2021-05-13 DIAGNOSIS — J410 Simple chronic bronchitis: Secondary | ICD-10-CM | POA: Diagnosis not present

## 2021-05-13 LAB — MAGNESIUM: Magnesium: 1.4 mg/dL — ABNORMAL LOW (ref 1.7–2.4)

## 2021-05-13 MED ORDER — FINASTERIDE 5 MG PO TABS
5.0000 mg | ORAL_TABLET | Freq: Every day | ORAL | Status: DC
  Administered 2021-05-13 – 2021-05-23 (×10): 5 mg via ORAL
  Filled 2021-05-13 (×10): qty 1

## 2021-05-13 MED ORDER — SERTRALINE HCL 100 MG PO TABS
100.0000 mg | ORAL_TABLET | Freq: Every morning | ORAL | Status: DC
Start: 2021-05-13 — End: 2021-05-24
  Administered 2021-05-13 – 2021-05-24 (×11): 100 mg via ORAL
  Filled 2021-05-13 (×11): qty 1

## 2021-05-13 MED ORDER — SODIUM CHLORIDE 0.9 % IV SOLN: INTRAVENOUS | Status: AC

## 2021-05-13 MED ORDER — TRAZODONE HCL 50 MG PO TABS
200.0000 mg | ORAL_TABLET | Freq: Every day | ORAL | Status: DC
  Administered 2021-05-13: 200 mg via ORAL
  Filled 2021-05-13: qty 4

## 2021-05-13 MED ORDER — MAGNESIUM OXIDE -MG SUPPLEMENT 400 (240 MG) MG PO TABS
800.0000 mg | ORAL_TABLET | Freq: Two times a day (BID) | ORAL | Status: AC
Start: 2021-05-13 — End: 2021-05-13
  Administered 2021-05-13 (×2): 800 mg via ORAL
  Filled 2021-05-13 (×2): qty 2

## 2021-05-13 MED ORDER — PREGABALIN 100 MG PO CAPS
200.0000 mg | ORAL_CAPSULE | Freq: Two times a day (BID) | ORAL | Status: DC
  Administered 2021-05-13 (×2): 200 mg via ORAL
  Filled 2021-05-13 (×2): qty 2

## 2021-05-13 MED ORDER — OXYCODONE-ACETAMINOPHEN 5-325 MG PO TABS
1.0000 | ORAL_TABLET | Freq: Four times a day (QID) | ORAL | Status: DC | PRN
  Administered 2021-05-13 – 2021-05-24 (×13): 1 via ORAL
  Filled 2021-05-13 (×14): qty 1

## 2021-05-13 MED ORDER — PANTOPRAZOLE SODIUM 40 MG PO TBEC
40.0000 mg | DELAYED_RELEASE_TABLET | Freq: Every day | ORAL | Status: DC
  Administered 2021-05-13: 40 mg via ORAL
  Filled 2021-05-13: qty 1

## 2021-05-13 MED ORDER — ATORVASTATIN CALCIUM 40 MG PO TABS
40.0000 mg | ORAL_TABLET | Freq: Every day | ORAL | Status: DC
Start: 2021-05-13 — End: 2021-05-24
  Administered 2021-05-13 – 2021-05-23 (×10): 40 mg via ORAL
  Filled 2021-05-13 (×10): qty 1

## 2021-05-13 MED ORDER — FLECAINIDE ACETATE 100 MG PO TABS
100.0000 mg | ORAL_TABLET | Freq: Two times a day (BID) | ORAL | Status: DC
  Administered 2021-05-13 – 2021-05-24 (×21): 100 mg via ORAL
  Filled 2021-05-13 (×25): qty 1

## 2021-05-13 MED ORDER — ASPIRIN EC 325 MG PO TBEC
325.0000 mg | DELAYED_RELEASE_TABLET | Freq: Every morning | ORAL | Status: DC
  Administered 2021-05-13: 325 mg via ORAL
  Filled 2021-05-13: qty 1

## 2021-05-13 NOTE — TOC Initial Note (Signed)
Transition of Care (TOC) - Initial/Assessment Note  ? ? ?Patient Details  ?Name: Marcus Mcgee ?MRN: [id number] ?Date of Birth: [birth year] ? ?Transition of Care [facility]) CM/SW Contact:    ?Epifanio Lesches, RN ?Phone Number: ?05/13/2021, 3:08 PM ? ?Clinical Narrative:    ? - status post right femur open reduction internal fixation, 3/1            ?NCM spoke with pt and pt 's wife regarding d/c planning. Both in agreement with SNF placement if needed. Preferably, [facility]. For home health services preference, [facility]. States has used them in the past. Wife states pt without DME needs ?PTA  independent with ADL's.  ? ?PT/OT evaluations pending.... ? ?TOC team following and will assist with needs. ? ?Expected Discharge Plan: Skilled Nursing Facility ?Barriers to Discharge: Continued Medical Work up ? ? ?Patient Goals and CMS Choice ?  ?  ?Choice offered to / list presented to : Patient ? ?Expected Discharge Plan and Services ?Expected Discharge Plan: Skilled Nursing Facility ?  ?Discharge Planning Services: CM Consult ?Post Acute Care Choice: Durable Medical Equipment (hospital bed) ?Living arrangements for the past 2 months: Single Family Home ?                ?  ?  ?  ?  ?  ?  ?HH Agency: [facility] ?Date HH Agency Contacted: 05/13/21 ?Time HH Agency Contacted: 1459 ?Representative spoke with at [facility] Agency: Barbara Cower ? ?Prior Living Arrangements/Services ?Living arrangements for the past 2 months: Single Family Home ?Lives with:: Spouse ?  ?Do you feel safe going back to the place where you live?: Yes      ?  ?  ?  ?  ? ?Activities of Daily Living ?Home Assistive Devices/Equipment: Cane (specify quad or straight) ?ADL Screening (condition at time of admission) ?Patient's cognitive ability adequate to safely complete daily activities?: Yes ?Is the patient deaf or have difficulty hearing?: Yes ?Does the patient have difficulty seeing, even when wearing glasses/contacts?: No ?Does the  patient have difficulty concentrating, remembering, or making decisions?: Yes ?Patient able to express need for assistance with ADLs?: Yes ?Does the patient have difficulty dressing or bathing?: Yes ?Independently performs ADLs?: No ?Communication: Independent ?Dressing (OT): Needs assistance ?Is this a change from baseline?: Pre-admission baseline ?Grooming: Needs assistance ?Is this a change from baseline?: Pre-admission baseline ?Feeding: Independent ?Bathing: Needs assistance ?Is this a change from baseline?: Change from baseline, expected to last >3 days ?Toileting: Needs assistance ?Is this a change from baseline?: Change from baseline, expected to last >3days ?In/Out Bed: Needs assistance ?Is this a change from baseline?: Change from baseline, expected to last >3 days ?Walks in Home: Needs assistance ?Is this a change from baseline?: Change from baseline, expected to last >3 days ?Does the patient have difficulty walking or climbing stairs?: Yes ?Weakness of Legs: Right ?Weakness of Arms/Hands: Left ? ?Permission Sought/Granted ?  ?Permission granted to share information with : Yes, Verbal Permission Granted ? Share Information with NAME: Jeremiah Hasenauer (Spouse)  [phone] ?   ?   ?   ? ?Emotional Assessment ?Appearance:: Appears stated age ?Attitude/Demeanor/Rapport: Engaged ?Affect (typically observed): Accepting ?Orientation: : Oriented to Self, Oriented to Place, Oriented to  Time, Oriented to Situation ?Alcohol / Substance Use: Not Applicable ?Psych Involvement: No (comment) ? ?Admission diagnosis:  Trauma [T14.90XA] ?Closed displaced comminuted fracture of shaft of right femur, initial encounter (HCC) [S72.351A] ?Closed fracture of right femur, unspecified fracture morphology,  initial encounter ([place]) [S72.91XA] ?Patient Active Problem List  ? Diagnosis Date Noted  ? Stroke [facility]) 05/11/2021  ? Closed fracture of right femur, unspecified fracture morphology, initial encounter ([place]) 05/11/2021  ?  Hyponatremia 05/11/2021  ? Pacemaker 05/11/2021  ? Essential hypertension 05/11/2021  ? COPD (chronic obstructive pulmonary disease) ([place]) 05/11/2021  ? Prolonged QT interval 05/11/2021  ? History of CVA (cerebrovascular accident) 05/11/2021  ? ?PCP:  Dineen Kid, MD ?Pharmacy:   ?[id number] - [place], [place] - [address] ?[address] ?[place] [address] ?Phone: [phone] Fax: [phone] ? ?[place], [place] ?[address] ?[place] [address] ?Phone: [phone] Fax: [phone] ? ?[place] ([facility] ) - [place], [place] ?[address] ?[place] [address] ?Phone: [phone] Fax: [phone] ? ? ? ? ?Social Determinants of Health (SDOH) Interventions ?  ? ?Readmission Risk Interventions ?No flowsheet data found. ? ? ?

## 2021-05-13 NOTE — Progress Notes (Signed)
? ?  Subjective: ? ?Patient reports pain as mild.  Slept comfortably overnight.  Has not had any food this morning.  Voiding. ? ?Objective:  ? ?VITALS:   ?Vitals:  ? 05/12/21 2215 05/12/21 2230 05/13/21 0017 05/13/21 0340  ?BP: 117/73 (!) 114/99 119/61 124/61  ?Pulse: 90 82 79 81  ?Resp:  16 12 14   ?Temp:  98.2 ?F (36.8 ?C) 98.2 ?F (36.8 ?C) 98 ?F (36.7 ?C)  ?TempSrc:  Axillary Axillary Axillary  ?SpO2: 94% 91% 96% 97%  ?Weight:      ?Height:      ? ? ?Dressing over right lower extremity is clean dry intact.  Is able to fire extensors of all of his toes as well as the flexors of the right foot.  Sensation is intact in all distributions of the right foot.  2+ dorsalis pedis pulse ? ? ?Lab Results  ?Component Value Date  ? WBC 12.1 (H) 05/12/2021  ? HGB 9.2 (L) 05/12/2021  ? HCT 27.0 (L) 05/12/2021  ? MCV 86.4 05/12/2021  ? PLT 228 05/12/2021  ? ? ? ?Assessment/Plan: ? ?1 Day Post-Op status post right femur open reduction internal fixation, doing well ? ?- Expected postop acute blood loss anemia - will monitor for symptoms ?- Patient to work with PT/OT to optimize mobilization safely ?- DVT ppx - SCDs, ambulation, Lovenox ?- Postoperative Abx: Ancef x 2 additional doses given ?- NWB operative extremity ?- Pain control - multimodal pain management, ATC acetaminophen in conjunction with as needed narcotic (oxycodone), although this should be minimized with other modalities  ?- Discharge planning pending CM, appreciate coordination  ? ? ?Vanetta Mulders ?05/13/2021, 7:34 AM ? ?

## 2021-05-13 NOTE — Plan of Care (Signed)

## 2021-05-13 NOTE — Progress Notes (Signed)
[facility] PROGRESS NOTE    Progress Note  ARREN EDLUND  [id number] DOB: 05-21-1934 DOA: 05/11/2021 PCP: Dineen Kid, MD     Brief Narrative:   Marcus Mcgee is an 87 y.o. male past medical history significant for essential hypertension, pacemaker with history arrhythmia, history of CVA who presents by EMS after a fall after this he noticed deformity of his right thigh EMS was called according to his wife he has been having difficulty with balance and has had couple of falls with no injuries.  He saw neurologist the day prior to admission as he was having memory difficulties.  Family relates he has been falling for the last several months. Orthopedic surgery was consulted    Assessment/Plan:   Closed fracture of right femur, unspecified fracture morphology, initial encounter [facility]): Orthopedic surgery was consulted he is status post open reduction internal fixation of the distal femur on 05/12/2021 Continue narcotics, and anticoagulation per orthopedic surgery they recommended aspirin twice a day as an outpatient. Continue MiraLAX twice daily. Check an H&H tomorrow morning. Physical therapy evaluation is pending. Try to keep patient out of bed to chair.   Acute kidney injury: Likely prerenal azotemia, started on IV fluids creatinine is improving slowly we will continue IV fluids for an additional 12 hours.  Essential hypertension: Blood pressure is stable. Continue IV hydralazine as needed, his blood pressure seems to be well controlled.  COPD: Stable, continue inhalers.  Mild hyponatremia Likely hypovolemic continue IV fluids recheck in the morning.  Prolonged QTc: Noted.  Check a magnesium level, potassium is 4.1. Resume flecainide.  History of CVA: CT of the head and neck show no large vessel occlusion unchanged severe stenosis of the right PCA P1 segment and unchanged also on the left 2D echo EF of 65% with grade 1 diastolic heart failure no wall motion  abnormality.  History of nonobstructive coronary artery disease status post cath in May 2010: Resume aspirin.    History of pacemaker Resume flecainide home dose.  BPH: Resume Proscar and Flomax.    DVT prophylaxis: lovenox Family Communication:daughter  Status is: Inpatient Remains inpatient appropriate because: Acute closed hip fracture      Code Status:     Code Status Orders  (From admission, onward)           Start     Ordered   05/11/21 2322  Full code  Continuous        05/11/21 2321           Code Status History     This patient has a current code status but no historical code status.      Advance Directive Documentation    Surprise Most Recent Value  Type of Advance Directive Living will  Pre-existing out of facility DNR order (yellow form or pink MOST form) --  "MOST" Form in Place? --         IV Access:   Peripheral IV   Procedures and diagnostic studies:   CT ANGIO HEAD NECK W WO CM  Result Date: 05/12/2021 CLINICAL DATA:  Transient ischemic attack EXAM: CT ANGIOGRAPHY HEAD AND NECK TECHNIQUE: Multidetector CT imaging of the head and neck was performed using the standard protocol during bolus administration of intravenous contrast. Multiplanar CT image reconstructions and MIPs were obtained to evaluate the vascular anatomy. Carotid stenosis measurements (when applicable) are obtained utilizing NASCET criteria, using the distal internal carotid diameter as the denominator. RADIATION DOSE REDUCTION: This exam was performed  according to the departmental dose-optimization program which includes automated exposure control, adjustment of the mA and/or kV according to patient size and/or use of iterative reconstruction technique. CONTRAST:  41mL OMNIPAQUE IOHEXOL 350 MG/ML SOLN COMPARISON:  03/21/2021 head CT 10/22/2019 CTA head neck FINDINGS: CT HEAD FINDINGS Brain: There is no mass, hemorrhage or extra-axial collection. There is  generalized atrophy without lobar predilection. Old left basal ganglia small vessel infarct. There is hypoattenuation of the periventricular white matter, most commonly indicating chronic ischemic microangiopathy. Skull: The visualized skull base, calvarium and extracranial soft tissues are normal. Sinuses/Orbits: No fluid levels or advanced mucosal thickening of the visualized paranasal sinuses. No mastoid or middle ear effusion. The orbits are normal. CTA NECK FINDINGS SKELETON: There is no bony spinal canal stenosis. No lytic or blastic lesion. OTHER NECK: Normal pharynx, larynx and major salivary glands. No cervical lymphadenopathy. Unremarkable thyroid gland. UPPER CHEST: No pneumothorax or pleural effusion. No nodules or masses. AORTIC ARCH: There is calcific atherosclerosis of the aortic arch. There is no aneurysm, dissection or hemodynamically significant stenosis of the visualized portion of the aorta. Normal variant aortic arch branching pattern with the brachiocephalic and left common carotid arteries sharing a common origin. The visualized proximal subclavian arteries are widely patent. RIGHT CAROTID SYSTEM: No dissection, occlusion or aneurysm. Mild atherosclerotic calcification at the carotid bifurcation without hemodynamically significant stenosis. LEFT CAROTID SYSTEM: No dissection, occlusion or aneurysm. Mild atherosclerotic calcification at the carotid bifurcation without hemodynamically significant stenosis. VERTEBRAL ARTERIES: Left dominant configuration. Both origins are clearly patent. There is no dissection, occlusion or flow-limiting stenosis to the skull base (V1-V3 segments). CTA HEAD FINDINGS POSTERIOR CIRCULATION: --Vertebral arteries: Normal V4 segments. --Inferior cerebellar arteries: Normal. --Basilar artery: Normal. --Superior cerebellar arteries: Normal. --Posterior cerebral arteries (PCA): Severe stenosis of the right P1 segment. There is occlusion of the left P1 segment, but the P2  segment is patent via supply from the left P-comm. ANTERIOR CIRCULATION: --Intracranial internal carotid arteries: Normal. --Anterior cerebral arteries (ACA): Normal. Both A1 segments are present. Patent anterior communicating artery (a-comm). --Middle cerebral arteries (MCA): Normal. VENOUS SINUSES: As permitted by contrast timing, patent. ANATOMIC VARIANTS: None Review of the MIP images confirms the above findings. IMPRESSION: 1. No emergent large vessel occlusion. 2. Unchanged severe stenosis of the right PCA P1 segment. 3. Unchanged occlusion of the left PCA P1 segment, but the P2 segment is patent via supply from the left P-comm. Aortic Atherosclerosis (ICD10-I70.0). Electronically Signed   By: Ulyses Jarred M.D.   On: 05/12/2021 00:07   DG Ankle Complete Left  Result Date: 05/11/2021 CLINICAL DATA:  Level 2 trauma.  Fall. EXAM: LEFT ANKLE COMPLETE - 3+ VIEW COMPARISON:  None. FINDINGS: Large calcaneal heel spur. Minimal chronic enthesopathic change at the Achilles insertion on the calcaneus. Mild medial tibiotalar joint space narrowing. Moderate talonavicular joint space narrowing. No acute fracture or dislocation. Mild-to-moderate lateral malleolar soft tissue swelling. IMPRESSION:: IMPRESSION: 1. Mild-to-moderate lateral malleolar soft tissue swelling. No acute fracture is seen. 2. Large calcaneal heel spur. Electronically Signed   By: Yvonne Kendall M.D.   On: 05/11/2021 21:59   DG Pelvis Portable  Result Date: 05/11/2021 CLINICAL DATA:  [place] EXAM: PORTABLE PELVIS 1-2 VIEWS COMPARISON:  None. FINDINGS: Single frontal view of the pelvis excludes portions of the left iliac crest and proximal right femur by collimation. Bilateral hip arthroplasties are in the expected position without signs of acute complication. No acute displaced fractures. Soft tissues are unremarkable. IMPRESSION: 1. Unremarkable pelvis and bilateral hips.  Electronically Signed   By: Randa Ngo M.D.   On: 05/11/2021 22:09   DG  Chest Port 1 View  Result Date: 05/11/2021 CLINICAL DATA:  Level 2 trauma, fell EXAM: PORTABLE CHEST 1 VIEW COMPARISON:  03/20/2013 FINDINGS: Single frontal view of the chest demonstrates stable dual lead pacer. The cardiac silhouette is unremarkable. No acute airspace disease, effusion, or pneumothorax. No acute displaced fractures. Stable posttraumatic or postsurgical changes of the right fourth rib. IMPRESSION: 1. No acute intrathoracic process. Electronically Signed   By: Randa Ngo M.D.   On: 05/11/2021 22:06   DG C-Arm 1-60 Min-No Report  Result Date: 05/12/2021 Fluoroscopy was utilized by the requesting physician.  No radiographic interpretation.   DG C-Arm 1-60 Min-No Report  Result Date: 05/12/2021 Fluoroscopy was utilized by the requesting physician.  No radiographic interpretation.   DG C-Arm 1-60 Min-No Report  Result Date: 05/12/2021 Fluoroscopy was utilized by the requesting physician.  No radiographic interpretation.   ECHOCARDIOGRAM COMPLETE  Result Date: 05/12/2021    ECHOCARDIOGRAM REPORT   Patient Name:   YAFET SKEES Date of Exam: 05/12/2021 Medical Rec #:  [id number]    Height:       75.0 in Accession #:    [id number]   Weight:       200.0 lb Date of Birth:  01/26/1935   BSA:          2.194 m Patient Age:    26 years     BP:           121/85 mmHg Patient Gender: M            HR:           77 bpm. Exam Location:  Inpatient Procedure: 2D Echo Indications:    TIA  History:        Patient has no prior history of Echocardiogram examinations.                 COPD and Stroke; Risk Factors:Hypertension.  Sonographer:    Arlyss Gandy Referring Phys: [id number] Eben Burow  Sonographer Comments: Technically difficult study due to poor echo windows. Image acquisition challenging due to patient body habitus, Image acquisition challenging due to respiratory motion and supine. IMPRESSIONS  1. Left ventricular ejection fraction, by estimation, is 65 to 70%. The left ventricle has normal  function. The left ventricle has no regional wall motion abnormalities. There is mild left ventricular hypertrophy. Left ventricular diastolic parameters are consistent with Grade I diastolic dysfunction (impaired relaxation).  2. Right ventricular systolic function is normal. The right ventricular size is normal. There is normal pulmonary artery systolic pressure.  3. The mitral valve is normal in structure. Trivial mitral valve regurgitation.  4. The aortic valve is normal in structure. Aortic valve regurgitation is not visualized. FINDINGS  Left Ventricle: Left ventricular ejection fraction, by estimation, is 65 to 70%. The left ventricle has normal function. The left ventricle has no regional wall motion abnormalities. The left ventricular internal cavity size was normal in size. There is  mild left ventricular hypertrophy. Left ventricular diastolic parameters are consistent with Grade I diastolic dysfunction (impaired relaxation). Right Ventricle: The right ventricular size is normal. Right vetricular wall thickness was not well visualized. Right ventricular systolic function is normal. There is normal pulmonary artery systolic pressure. The tricuspid regurgitant velocity is 2.47 m/s, and with an assumed right atrial pressure of 8 mmHg, the estimated right ventricular systolic pressure is [id number] mmHg. Left Atrium: Left  atrial size was normal in size. Right Atrium: Right atrial size was normal in size. Pericardium: There is no evidence of pericardial effusion. Mitral Valve: The mitral valve is normal in structure. Trivial mitral valve regurgitation. Tricuspid Valve: The tricuspid valve is normal in structure. Tricuspid valve regurgitation is trivial. Aortic Valve: The aortic valve is normal in structure. Aortic valve regurgitation is not visualized. Aortic valve mean gradient measures 2.0 mmHg. Aortic valve peak gradient measures 4.1 mmHg. Aortic valve area, by VTI measures 2.40 cm. Pulmonic Valve: The pulmonic  valve was normal in structure. Pulmonic valve regurgitation is not visualized. Aorta: The aortic root and ascending aorta are structurally normal, with no evidence of dilitation. IAS/Shunts: The atrial septum is grossly normal.  LEFT VENTRICLE PLAX 2D LVIDd:         3.70 cm   Diastology LVIDs:         2.40 cm   LV e' medial:    5.33 cm/s LV PW:         1.10 cm   LV E/e' medial:  12.1 LV IVS:        1.10 cm   LV e' lateral:   8.38 cm/s LVOT diam:     2.00 cm   LV E/e' lateral: 7.7 LV SV:         48 LV SV Index:   22 LVOT Area:     3.14 cm  RIGHT VENTRICLE RV S prime:     11.90 cm/s TAPSE (M-mode): 1.7 cm LEFT ATRIUM             Index LA diam:        3.40 cm 1.55 cm/m LA Vol (A2C):   40.3 ml 18.37 ml/m LA Vol (A4C):   56.2 ml 25.61 ml/m LA Biplane Vol: 47.9 ml 21.83 ml/m  AORTIC VALVE AV Area (Vmax):    2.58 cm AV Area (Vmean):   2.56 cm AV Area (VTI):     2.40 cm AV Vmax:           101.00 cm/s AV Vmean:          67.900 cm/s AV VTI:            0.200 m AV Peak Grad:      4.1 mmHg AV Mean Grad:      2.0 mmHg LVOT Vmax:         82.90 cm/s LVOT Vmean:        55.300 cm/s LVOT VTI:          0.153 m LVOT/AV VTI ratio: 0.76  AORTA Ao Root diam: 3.70 cm Ao Asc diam:  3.70 cm MITRAL VALVE               TRICUSPID VALVE MV Area (PHT): 1.98 cm    TR Peak grad:   24.4 mmHg MV Decel Time: 384 msec    TR Vmax:        247.00 cm/s MV E velocity: 64.50 cm/s MV A velocity: 98.50 cm/s  SHUNTS MV E/A ratio:  0.65        Systemic VTI:  0.15 m                            Systemic Diam: 2.00 cm Mertie Moores MD Electronically signed by Mertie Moores MD Signature Date/Time: 05/12/2021/11:44:18 AM    Final    DG FEMUR PORT, 1V RIGHT  Result Date: 05/11/2021 CLINICAL DATA:  [place] EXAM:  RIGHT FEMUR PORTABLE 1 VIEW COMPARISON:  None. FINDINGS: Two frontal views of the right femur are obtained. There is a comminuted displaced fracture involving the mid femoral diaphysis. The fracture extends along the posterior margin of the distal femur,  to the level of the femoral component of the right knee arthroplasty. There is external rotation of the distal fracture fragments. Orthopedic hardware from the right hip and right knee arthroplasties appear unremarkable. IMPRESSION: 1. Comminuted periprosthetic fracture of the mid and distal right femur as above. External rotation of the distal fracture fragments. Electronically Signed   By: Randa Ngo M.D.   On: 05/11/2021 22:09   DG FEMUR, MIN 2 VIEWS RIGHT  Result Date: 05/12/2021 CLINICAL DATA:  Fluoroscopic assistance for internal fixation of fracture of right femur EXAM: RIGHT FEMUR 2 VIEWS COMPARISON:  Study done on 05/11/2021 FINDINGS: There is interval reduction of comminuted displaced fracture in the mid and distal shaft of right femur with metallic sideplate and multiple surgical screws. There is marked improvement in alignment of fracture fragments. There is previous right hip and right knee arthroplasty. Fluoroscopic time was 4 minutes and 57 seconds. Radiation dose is 43.64 mGy. IMPRESSION: Fluoroscopic assistance was provided for reduction and internal fixation of severely comminuted fracture of mid and distal shaft of right femur. Electronically Signed   By: Elmer Picker M.D.   On: 05/12/2021 20:58   DG FEMUR PORT MIN 2 VIEWS LEFT  Result Date: 05/12/2021 CLINICAL DATA:  Encounter for post surgery. Per history given, likely incorrect side obtained as the right side radiographs demonstrate recent postsurgical changes. EXAM: LEFT FEMUR PORTABLE 2 VIEWS COMPARISON:  None. FINDINGS: Total left hip arthroplasty. Total left knee arthroplasty. There is no evidence of fracture or other focal bone lesions. Soft tissues are unremarkable. IMPRESSION: 1. Total LEFT hip arthroplasty. 2. Total LEFT knee arthroplasty. 3.  No acute displaced fracture or dislocation. Electronically Signed   By: Iven Finn M.D.   On: 05/12/2021 22:19   DG FEMUR PORT, MIN 2 VIEWS RIGHT  Result Date:  05/12/2021 CLINICAL DATA:  Right femoral fracture repair EXAM: RIGHT FEMUR PORTABLE 2 VIEW COMPARISON:  05/11/2021 FINDINGS: Frontal and lateral views of the right femur are obtained. Lateral plate and screw fixation spans the comminuted distal right femoral fracture seen previously. Alignment is near anatomic. Unremarkable right hip and right knee arthroplasty. Postsurgical changes are seen in the soft tissues. IMPRESSION: 1. ORIF comminuted distal right femoral fracture, with near anatomic alignment. Electronically Signed   By: Randa Ngo M.D.   On: 05/12/2021 22:24     Medical Consultants:   None.   Subjective:    DARROLD TRIPPLETT relates his spasms are better, has no appetite this morning.  Objective:    Vitals:   05/12/21 2215 05/12/21 2230 05/13/21 0017 05/13/21 0340  BP: 117/73 (!) 114/99 119/61 124/61  Pulse: 90 82 79 81  Resp:  16 12 14   Temp:  98.2 F (36.8 C) 98.2 F (36.8 C) 98 F (36.7 C)  TempSrc:  Axillary Axillary Axillary  SpO2: 94% 91% 96% 97%  Weight:      Height:       SpO2: 97 % O2 Flow Rate (L/min): 2 L/min   Intake/Output Summary (Last 24 hours) at 05/13/2021 0702 Last data filed at 05/13/2021 0534 Gross per 24 hour  Intake 2376.67 ml  Output 1040 ml  Net 1336.67 ml   Filed Weights   05/11/21 2152  Weight: 90.7 kg    Exam:  General exam: In no acute distress. Respiratory system: Good air movement clear to auscultation. Cardiovascular system: S1 & S2 heard, RRR. No JVD. Gastrointestinal system: Abdomen is nondistended, soft and nontender.  Extremities: No pedal edema. Skin: No rashes, lesions or ulcers Psychiatry: Judgement and insight appear normal. Mood & affect appropriate.   Data Reviewed:    Labs: Basic Metabolic Panel: Recent Labs  Lab 05/11/21 0020 05/11/21 2140 05/12/21 0544 05/12/21 1957  NA 134* 133* 133* 134*  K 5.0 4.8 5.1 4.1  CL 98 100 100 98  CO2 25  --  24  --   GLUCOSE 127* 128* 142* 127*  BUN 18 20 23 23    CREATININE 1.25* 1.10 1.46* 1.30*  CALCIUM 9.0  --  8.9  --     GFR Estimated Creatinine Clearance: 50.6 mL/min (A) (by C-G formula based on SCr of 1.3 mg/dL (H)). Liver Function Tests: Recent Labs  Lab 05/11/21 0020  AST 27  ALT 16  ALKPHOS 59  BILITOT 0.3  PROT 5.7*  ALBUMIN 3.6    No results for input(s): LIPASE, AMYLASE in the last 168 hours. No results for input(s): AMMONIA in the last 168 hours. Coagulation profile Recent Labs  Lab 05/11/21 2132  INR 1.0    COVID-19 Labs  No results for input(s): DDIMER, FERRITIN, LDH, CRP in the last 72 hours.  Lab Results  Component Value Date   [place] NEGATIVE 05/11/2021    CBC: Recent Labs  Lab 05/11/21 0020 05/11/21 2140 05/12/21 0544 05/12/21 1957  WBC 9.1  --  12.1*  --   HGB 13.2 13.6 10.5* 9.2*  HCT 39.4 40.0 29.8* 27.0*  MCV 91.0  --  86.4  --   PLT 202  --  228  --     Cardiac Enzymes: No results for input(s): CKTOTAL, CKMB, CKMBINDEX, TROPONINI in the last 168 hours. BNP (last 3 results) No results for input(s): PROBNP in the last 8760 hours. CBG: No results for input(s): GLUCAP in the last 168 hours. D-Dimer: No results for input(s): DDIMER in the last 72 hours. Hgb A1c: No results for input(s): HGBA1C in the last 72 hours. Lipid Profile: No results for input(s): CHOL, HDL, LDLCALC, TRIG, CHOLHDL, LDLDIRECT in the last 72 hours. Thyroid function studies: No results for input(s): TSH, T4TOTAL, T3FREE, THYROIDAB in the last 72 hours.  Invalid input(s): FREET3 Anemia work up: No results for input(s): VITAMINB12, FOLATE, FERRITIN, TIBC, IRON, RETICCTPCT in the last 72 hours. Sepsis Labs: Recent Labs  Lab 05/11/21 0020 05/11/21 2132 05/12/21 0544  WBC 9.1  --  12.1*  LATICACIDVEN  --  3.7*  --     Microbiology Recent Results (from the past 240 hour(s))  Resp Panel by RT-PCR (Flu A&B, Covid) Nasopharyngeal Swab     Status: None   Collection Time: 05/11/21  9:32 PM   Specimen:  Nasopharyngeal Swab; Nasopharyngeal(NP) swabs in vial transport medium  Result Value Ref Range Status   SARS Coronavirus 2 by RT PCR NEGATIVE NEGATIVE Final    Comment: (NOTE) SARS-CoV-2 target nucleic acids are NOT DETECTED.  The SARS-CoV-2 RNA is generally detectable in upper respiratory specimens during the acute phase of infection. The lowest concentration of SARS-CoV-2 viral copies this assay can detect is 138 copies/mL. A negative result does not preclude SARS-Cov-2 infection and should not be used as the sole basis for treatment or other patient management decisions. A negative result may occur with  improper specimen collection/handling, submission of specimen other than nasopharyngeal swab,  presence of viral mutation(s) within the areas targeted by this assay, and inadequate number of viral copies(<138 copies/mL). A negative result must be combined with clinical observations, patient history, and epidemiological information. The expected result is Negative.  Fact Sheet for Patients:  EntrepreneurPulse.com.au  Fact Sheet for Healthcare Providers:  IncredibleEmployment.be  This test is no t yet approved or cleared by the [place] FDA and  has been authorized for detection and/or diagnosis of SARS-CoV-2 by FDA under an Emergency Use Authorization (EUA). This EUA will remain  in effect (meaning this test can be used) for the duration of the COVID-19 declaration under Section 564(b)(1) of the Act, 21 U.S.C.section 360bbb-3(b)(1), unless the authorization is terminated  or revoked sooner.       Influenza A by PCR NEGATIVE NEGATIVE Final   Influenza B by PCR NEGATIVE NEGATIVE Final    Comment: (NOTE) The Xpert Xpress SARS-CoV-2/FLU/RSV plus assay is intended as an aid in the diagnosis of influenza from Nasopharyngeal swab specimens and should not be used as a sole basis for treatment. Nasal washings and aspirates are unacceptable for  Xpert Xpress SARS-CoV-2/FLU/RSV testing.  Fact Sheet for Patients: EntrepreneurPulse.com.au  Fact Sheet for Healthcare Providers: IncredibleEmployment.be  This test is not yet approved or cleared by the [place] FDA and has been authorized for detection and/or diagnosis of SARS-CoV-2 by FDA under an Emergency Use Authorization (EUA). This EUA will remain in effect (meaning this test can be used) for the duration of the COVID-19 declaration under Section 564(b)(1) of the Act, 21 U.S.C. section 360bbb-3(b)(1), unless the authorization is terminated or revoked.  Performed at [facility], [place] [address], [place], [place] [address]      Medications:    enoxaparin (LOVENOX) injection  40 mg Subcutaneous Q24H   polyethylene glycol  17 g Oral BID   Continuous Infusions:  methocarbamol (ROBAXIN) IV Stopped (05/12/21 1121)      LOS: 2 days   Charlynne Cousins  [facility]  05/13/2021, 7:02 AM

## 2021-05-13 NOTE — Evaluation (Signed)
Physical Therapy Evaluation ?Patient Details ?Name: Marcus Mcgee ?MRN: [id number] ?DOB: November 02, 1934 ?Today's Date: 05/13/2021 ? ?History of Present Illness ? 87 yo admitted 2/28 after fall at home with Rt femur fx s/p ORIF 3/1. PMhx: bil TKA, bil THA, HTN, cOPD, CVA  ?Clinical Impression ? Pt premedicated and limited by pain, strength and lack of functional mobility. Wife reports pt has been falling frequently and refuses to use a RW at home as he has been instructed by practitioners. Pt today requiring mod-max assist for bed level mobility and unable to stand or scoot maintaining NWB status. Pt will benefit from acute therapy to maximize mobility, safety and function to decrease burden of care.  ? ?HR 82 ?SpO2 95% on RA   ?   ? ?Recommendations for follow up therapy are one component of a multi-disciplinary discharge planning process, led by the attending physician.  Recommendations may be updated based on patient status, additional functional criteria and insurance authorization. ? ?Follow Up Recommendations Skilled nursing-short term rehab (<3 hours/day) ? ?  ?Assistance Recommended at Discharge Frequent or constant Supervision/Assistance  ?Patient can return home with the following ? Two people to help with walking and/or transfers;Two people to help with bathing/dressing/bathroom;Assist for transportation;Help with stairs or ramp for entrance;Direct supervision/assist for medications management;Assistance with cooking/housework ? ?  ?Equipment Recommendations None recommended by PT  ?Recommendations for Other Services ?    ?  ?Functional Status Assessment Patient has had a recent decline in their functional status and/or demonstrates limited ability to make significant improvements in function in a reasonable and predictable amount of time  ? ?  ?Precautions / Restrictions Precautions ?Precautions: Fall ?Restrictions ?RLE Weight Bearing: Non weight bearing  ? ?  ? ?Mobility ? Bed Mobility ?Overal bed mobility:  Needs Assistance ?Bed Mobility: Supine to Sit, Sit to Supine, Rolling ?Rolling: Max assist ?  ?Supine to sit: Max assist, HOB elevated ?Sit to supine: Mod assist ?  ?General bed mobility comments: max assist to bring legs off of bed and pivot trunk as well as trunk elevation. Return to supine mod assist to bring legs to surface. Mod assist to slide toward [facility] in trendelenburg ?  ? ?Transfers ?  ?  ?  ?  ?  ?  ?  ?  ?  ?General transfer comment: pt unable to attempt with NWB status. Pt could not perform lateral scooting EOB ?  ? ?Ambulation/Gait ?  ?  ?  ?  ?  ?  ?  ?  ? ?Stairs ?  ?  ?  ?  ?  ? ?Wheelchair Mobility ?  ? ?Modified Rankin (Stroke Patients Only) ?  ? ?  ? ?Balance Overall balance assessment: Needs assistance ?Sitting-balance support: No upper extremity supported ?Sitting balance-Leahy Scale: Fair ?Sitting balance - Comments: EOB without UE support ?  ?  ?  ?  ?  ?  ?  ?  ?  ?  ?  ?  ?  ?  ?  ?   ? ? ? ?Pertinent Vitals/Pain Pain Assessment ?Pain Assessment: Faces ?Faces Pain Scale: Hurts even more ?Pain Location: RLE with mobility ?Pain Descriptors / Indicators: Guarding, Operative site guarding ?Pain Intervention(s): Limited activity within patient's tolerance, Monitored during session, Premedicated before session, Repositioned  ? ? ?Home Living Family/patient expects to be discharged to:: Private residence ?Living Arrangements: Spouse/significant other ?Available Help at Discharge: Available 24 hours/day ?Type of Home: House ?Home Access: Other (comment) (lift and ramp into house) ?  ?  ?  ?  Home Layout: One level ?Home Equipment: Conservation officer, nature (2 wheels);Cane - single point;Tub bench;BSC/3in1;Grab bars - toilet;Grab bars - tub/shower;Wheelchair - manual;Hospital bed;Hand held Chief Technology Officer (4 wheels);Other (comment) ?Additional Comments: lift chair  ?  ?Prior Function Prior Level of Function : Needs assist ?  ?  ?  ?Physical Assist : ADLs (physical);Mobility (physical) ?  ?  ?Mobility  Comments: uses lift into house, was told to use RW but refuses and has had multiple falls ?ADLs Comments: wife assists with lower body dressing. Wife handles homemaking and medications, doesn't drive ?  ? ? ?Hand Dominance  ?   ? ?  ?Extremity/Trunk Assessment  ? Upper Extremity Assessment ?Upper Extremity Assessment: Generalized weakness ?  ? ?Lower Extremity Assessment ?Lower Extremity Assessment: RLE deficits/detail;LLE deficits/detail ?RLE Deficits / Details: able to tolerate grossly 90/90 hip/knee flexion and in supine only tolerating 30 degrees knee flexion. No significant AROM during session ?LLE Deficits / Details: decreased strength and coordination with grossly 2+/5 strength ?  ? ?Cervical / Trunk Assessment ?Cervical / Trunk Assessment: Other exceptions ?Cervical / Trunk Exceptions: rounded shoulders  ?Communication  ? Communication: HOH  ?Cognition Arousal/Alertness: Awake/alert, Suspect due to medications ?Behavior During Therapy: Flat affect ?Overall Cognitive Status: Impaired/Different from baseline ?Area of Impairment: Memory, Attention, Following commands ?  ?  ?  ?  ?  ?  ?  ?  ?  ?Current Attention Level: Selective ?Memory: Decreased short-term memory ?Following Commands: Follows one step commands consistently, Follows one step commands with increased time ?  ?  ?  ?  ?  ?  ? ?  ?General Comments   ? ?  ?Exercises    ? ?Assessment/Plan  ?  ?PT Assessment Patient needs continued PT services  ?PT Problem List Decreased strength;Decreased balance;Decreased cognition;Pain;Decreased range of motion;Decreased mobility;Decreased knowledge of use of DME;Decreased activity tolerance;Decreased safety awareness ? ?   ?  ?PT Treatment Interventions DME instruction;Functional mobility training;Balance training;Patient/family education;Gait training;Therapeutic activities;Therapeutic exercise;Cognitive remediation   ? ?PT Goals (Current goals can be found in the Care Plan section)  ?Acute Rehab PT  Goals ?Patient Stated Goal: be able to walk and not fall ?PT Goal Formulation: With patient/family ?Time For Goal Achievement: 05/27/21 ?Potential to Achieve Goals: Fair ? ?  ?Frequency Min 3X/week ?  ? ? ?Co-evaluation   ?  ?  ?  ?  ? ? ?  ?AM-PAC PT "6 Clicks" Mobility  ?Outcome Measure Help needed turning from your back to your side while in a flat bed without using bedrails?: A Lot ?Help needed moving from lying on your back to sitting on the side of a flat bed without using bedrails?: A Lot ?Help needed moving to and from a bed to a chair (including a wheelchair)?: Total ?Help needed standing up from a chair using your arms (e.g., wheelchair or bedside chair)?: Total ?Help needed to walk in hospital room?: Total ?Help needed climbing 3-5 steps with a railing? : Total ?6 Click Score: 8 ? ?  ?End of Session   ?Activity Tolerance: Patient limited by pain ?Patient left: in bed;with call bell/phone within reach;with bed alarm set ?Nurse Communication: Mobility status;Need for lift equipment;Weight bearing status ?PT Visit Diagnosis: Other abnormalities of gait and mobility (R26.89);Repeated falls (R29.6);Muscle weakness (generalized) (M62.81);Unsteadiness on feet (R26.81);Pain ?Pain - Right/Left: Right ?Pain - part of body: Leg ?  ? ?Time: [id number] ?PT Time Calculation (min) (ACUTE ONLY): 31 min ? ? ?Charges:   PT Evaluation ?$PT Eval Moderate Complexity: 1 Mod ?PT  Treatments ?$Therapeutic Activity: 8-22 mins ?  ?   ? ?Jimmy Stipes P, PT ?Acute Rehabilitation Services ?Pager: [phone] ?Office: [phone] ? ? ?Timothy Trudell B Vernell Back ?05/13/2021, 3:28 PM ? ?

## 2021-05-14 DIAGNOSIS — I10 Essential (primary) hypertension: Secondary | ICD-10-CM | POA: Diagnosis not present

## 2021-05-14 DIAGNOSIS — S72351A Displaced comminuted fracture of shaft of right femur, initial encounter for closed fracture: Secondary | ICD-10-CM | POA: Diagnosis not present

## 2021-05-14 DIAGNOSIS — S7291XA Unspecified fracture of right femur, initial encounter for closed fracture: Secondary | ICD-10-CM | POA: Diagnosis not present

## 2021-05-14 DIAGNOSIS — Z8673 Personal history of transient ischemic attack (TIA), and cerebral infarction without residual deficits: Secondary | ICD-10-CM | POA: Diagnosis not present

## 2021-05-14 LAB — BASIC METABOLIC PANEL
Anion gap: 9 (ref 5–15)
BUN: 18 mg/dL (ref 8–23)
CO2: 23 mmol/L (ref 22–32)
Calcium: 7.9 mg/dL — ABNORMAL LOW (ref 8.9–10.3)
Chloride: 99 mmol/L (ref 98–111)
Creatinine, Ser: 1.13 mg/dL (ref 0.61–1.24)
GFR, Estimated: >60 mL/min (ref >60)
Glucose, Bld: 144 mg/dL — ABNORMAL HIGH (ref 70–99)
Potassium: 4.2 mmol/L (ref 3.5–5.1)
Sodium: 131 mmol/L — ABNORMAL LOW (ref 135–145)

## 2021-05-14 LAB — HEMOGLOBIN AND HEMATOCRIT, BLOOD
HCT: 17.3 % — ABNORMAL LOW (ref 39.0–52.0)
HCT: 20.1 % — ABNORMAL LOW (ref 39.0–52.0)
Hemoglobin: 5.9 g/dL — CL (ref 13.0–17.0)
Hemoglobin: 7 g/dL — ABNORMAL LOW (ref 13.0–17.0)

## 2021-05-14 LAB — MAGNESIUM: Magnesium: 1.6 mg/dL — ABNORMAL LOW (ref 1.7–2.4)

## 2021-05-14 LAB — PREPARE RBC (CROSSMATCH)

## 2021-05-14 MED ORDER — ASPIRIN 300 MG RE SUPP
300.0000 mg | Freq: Every day | RECTAL | Status: DC
  Administered 2021-05-14: 300 mg via RECTAL
  Filled 2021-05-14 (×11): qty 1

## 2021-05-14 MED ORDER — SODIUM CHLORIDE 0.9% IV SOLUTION: Freq: Once | INTRAVENOUS | Status: DC

## 2021-05-14 MED ORDER — ASPIRIN EC 325 MG PO TBEC
325.0000 mg | DELAYED_RELEASE_TABLET | Freq: Every day | ORAL | Status: DC
  Administered 2021-05-15 – 2021-05-24 (×10): 325 mg via ORAL
  Filled 2021-05-14 (×10): qty 1

## 2021-05-14 MED ORDER — PANTOPRAZOLE SODIUM 40 MG PO TBEC
40.0000 mg | DELAYED_RELEASE_TABLET | Freq: Every day | ORAL | Status: DC
Start: 2021-05-14 — End: 2021-05-24
  Administered 2021-05-15 – 2021-05-24 (×10): 40 mg via ORAL
  Filled 2021-05-14 (×10): qty 1

## 2021-05-14 MED ORDER — WHITE PETROLATUM EX OINT
TOPICAL_OINTMENT | CUTANEOUS | Status: DC | PRN
  Filled 2021-05-14 (×3): qty 28.35

## 2021-05-14 MED ORDER — PANTOPRAZOLE SODIUM 40 MG IV SOLR
40.0000 mg | Freq: Every day | INTRAVENOUS | Status: DC
  Administered 2021-05-14: 40 mg via INTRAVENOUS
  Filled 2021-05-14 (×2): qty 10

## 2021-05-14 MED ORDER — SODIUM CHLORIDE 0.9 % IV SOLN: INTRAVENOUS | Status: DC

## 2021-05-14 NOTE — Plan of Care (Signed)

## 2021-05-14 NOTE — [place] FL2 (Signed)
?[place] MEDICAID FL2 LEVEL OF CARE SCREENING TOOL  ?  ? ?IDENTIFICATION  ?Patient Name: ?Marcus Mcgee Birthdate: 02/27/1935 Sex: male Admission Date (Current Location): ?05/11/2021  ?[place] and [place] Number: ? [place] ?  Facility and Address:  ?[facility] [place] [place] [address] [place], [place] [address] ?     Provider Number: ?[id number]  ?Attending Physician Name and Address:  ?Charlynne Cousins, MD ? Relative Name and Phone Number:  ?ADOLPH, SALAMI Spouse [phone]  [phone] ?   ?Current Level of Care: ?Hospital Recommended Level of Care: ?[place] Prior Approval Number: ?  ? ?Date Approved/Denied: ?  PASRR Number: ?[id number] ? ?Discharge Plan: ?SNF ?  ? ?Current Diagnoses: ?Patient Active Problem List  ? Diagnosis Date Noted  ? Stroke [facility]) 05/11/2021  ? Closed fracture of right femur, unspecified fracture morphology, initial encounter ([place]) 05/11/2021  ? Hyponatremia 05/11/2021  ? Pacemaker 05/11/2021  ? Essential hypertension 05/11/2021  ? COPD (chronic obstructive pulmonary disease) ([place]) 05/11/2021  ? Prolonged QT interval 05/11/2021  ? History of CVA (cerebrovascular accident) 05/11/2021  ? ? ?Orientation RESPIRATION BLADDER Height & Weight   ?  ?Self, Time, Situation, Place ? Normal External catheter Weight: 200 lb (90.7 kg) ?Height:  6\' 3"  (190.5 cm)  ?BEHAVIORAL SYMPTOMS/MOOD NEUROLOGICAL BOWEL NUTRITION STATUS  ?    Continent Diet (refer to d/c summary)  ?AMBULATORY STATUS COMMUNICATION OF NEEDS Skin   ?Extensive Assist Verbally Surgical wounds (s/p ORIF right femoral shaft,. ORIF right distal femoral periprosthetic fracture, 05/12/2021) ?  ?  ?  ?    ?     ?     ? ? ?Personal Care Assistance Level of Assistance  ?Bathing, Feeding, Dressing Bathing Assistance: Maximum assistance ?Feeding assistance: Independent ?Dressing Assistance: Maximum assistance ?   ? ?Functional Limitations Info  ?Sight, Hearing, Speech Sight Info: Adequate ?Hearing Info:  Adequate ?Speech Info: Adequate  ? ? ?SPECIAL CARE FACTORS FREQUENCY  ?PT (By licensed PT), OT (By licensed OT)   ?  ?PT Frequency: 5x/week, evaluate and treat ?OT Frequency: 5x/week, evaluate and treat ?  ?  ?  ?   ? ? ?Contractures Contractures Info: Not present  ? ? ?Additional Factors Info  ?Code Status, Allergies Code Status Info: Full code ?Allergies Info: No Known Allergies ?  ?  ?  ?   ? ?Current Medications (05/14/2021):  This is the current hospital active medication list ?Current Facility-Administered Medications  ?Medication Dose Route Frequency Provider Last Rate Last Admin  ? 0.9 %  sodium chloride infusion (Manually program via Guardrails IV Fluids)   Intravenous Once Kathryne Eriksson, NP      ? 0.9 %  sodium chloride infusion (Manually program via Guardrails IV Fluids)   Intravenous Once Aileen Fass, Tammi Klippel, MD      ? 0.9 %  sodium chloride infusion   Intravenous Continuous Charlynne Cousins, MD      ? acetaminophen (TYLENOL) tablet 650 mg  650 mg Oral Q6H PRN Vanetta Mulders, MD   650 mg at 05/13/21 [id number]  ? Or  ? acetaminophen (TYLENOL) suppository 650 mg  650 mg Rectal Q6H PRN Vanetta Mulders, MD      ? aspirin EC tablet 325 mg  325 mg Oral q morning Charlynne Cousins, MD   325 mg at 05/13/21 [id number]  ? atorvastatin (LIPITOR) tablet 40 mg  40 mg Oral QHS Charlynne Cousins, MD   40 mg at 05/13/21 2212  ? finasteride (PROSCAR) tablet  5 mg  5 mg Oral QHS Charlynne Cousins, MD   5 mg at 05/13/21 2212  ? flecainide (TAMBOCOR) tablet 100 mg  100 mg Oral BID Charlynne Cousins, MD   100 mg at 05/13/21 2212  ? hydrALAZINE (APRESOLINE) injection 5 mg  5 mg Intravenous Q8H PRN Vanetta Mulders, MD      ? ipratropium-albuterol (DUONEB) 0.5-2.5 (3) MG/3ML nebulizer solution 3 mL  3 mL Nebulization Q4H PRN Vanetta Mulders, MD      ? morphine (PF) 4 MG/ML injection 4 mg  4 mg Intravenous Q2H PRN Vanetta Mulders, MD   4 mg at 05/13/21 0640  ? oxyCODONE-acetaminophen (PERCOCET/ROXICET) 5-325 MG per tablet  1 tablet  1 tablet Oral QID PRN Charlynne Cousins, MD   1 tablet at 05/13/21 1105  ? pantoprazole (PROTONIX) EC tablet 40 mg  40 mg Oral Daily Charlynne Cousins, MD   40 mg at 05/13/21 [id number]  ? senna-docusate (Senokot-S) tablet 1 tablet  1 tablet Oral QHS PRN Vanetta Mulders, MD      ? sertraline (ZOLOFT) tablet 100 mg  100 mg Oral q morning Charlynne Cousins, MD   100 mg at 05/13/21 1052  ? ? ? ?Discharge Medications: ?Please see discharge summary for a list of discharge medications. ? ?Relevant Imaging Results: ? ?Relevant Lab Results: ? ? ?Additional Information ?SS# [id number] ? ?Joanne Chars, LCSW ? ? ? ? ?

## 2021-05-14 NOTE — TOC Progression Note (Signed)
Transition of Care (TOC) - Progression Note  ? ? ?Patient Details  ?Name: Marcus Mcgee ?MRN: [id number] ?Date of Birth: 1935-01-26 ? ?Transition of Care (TOC) CM/SW Contact  ?Joanne Chars, LCSW ?Phone Number: ?05/14/2021, 1:28 PM ? ?Clinical Narrative:   CSW spoke with pt wife and presented bed offers, she does want to accept offer at [facility], but also said she needed to speak with them about something that happened during a previous admission there.  She confirmed that she does want to have pt return there, but also is requesting a phone call. ? ?CSW spoke with Einar Pheasant at [facility], confirmed bed, they cannot take weekend admission so will need to be Monday if stable.  CSW informed her of the above and she will reach out to wife by phone. ? ?Will need auth closer to DC date.   ? ? ? ?Expected Discharge Plan: [place] ?Barriers to Discharge: Continued Medical Work up ? ?Expected Discharge Plan and Services ?Expected Discharge Plan: [place] ?  ?Discharge Planning Services: CM Consult ?Post Acute Care Choice: Durable Medical Equipment (hospital bed) ?Living arrangements for the past 2 months: [place] ?                ?  ?  ?  ?  ?  ?  ?[place] Agency: [facility] ([place]) ?Date HH Agency Contacted: 05/13/21 ?Time [place]: [id number] spoke with at [place]: Corene Cornea ? ? ?Social Determinants of Health (SDOH) Interventions ?  ? ?Readmission Risk Interventions ?No flowsheet data found. ? ?

## 2021-05-14 NOTE — Progress Notes (Signed)
[facility] PROGRESS NOTE    Progress Note  [name]  [id number] DOB: 1934/09/26 DOA: 05/11/2021 PCP: Dineen Kid, MD     Brief Narrative:   [name] is an 87 y.o. male past medical history significant for essential hypertension, pacemaker with history arrhythmia, history of CVA who presents by EMS after a fall after this he noticed deformity of his right thigh EMS was called according to his wife he has been having difficulty with balance and has had couple of falls with no injuries.  He saw neurologist the day prior to admission as he was having memory difficulties.  Family relates he has been falling for the last several months. Orthopedic surgery was consulted    Assessment/Plan:   Closed fracture of right femur, unspecified fracture morphology, initial encounter [facility] Orthopedic surgery was consulted he is status post open reduction internal fixation of the distal femur on 05/12/2021 Postop surgical hemoglobin 5.9, transfused 2 units of packed red blood cells check an H&H posttransfusion.   Hold narcotics narcotics, and anticoagulation per orthopedic surgery they recommended aspirin twice a day as an outpatient. Keep elevated greater than 45 degrees physical therapy evaluation, Done, no need to go to skilled temporarily.  Acute metabolic encephalopathy: Likely due to Lyrica trazodone, narcotics, severe anemia plus or minus anesthetic during the surgery. Keep him n.p.o. elevated greater than 45 degrees. On exam he is able to answer questions with a slow response moving all 4 extremities without any difficulties. Start him on gentle IV fluids.  Acute kidney injury: Likely prerenal azotemia, resolved with IV fluid resuscitation.  Essential hypertension: Blood pressure is stable. Continue IV hydralazine as needed, his blood pressure seems to be well controlled.  COPD: Stable, continue inhalers.  Mild hyponatremia Likely hypovolemic .  Prolonged  QTc: Noted.  Potassium 4.2, magnesium 1.4 was repleted orally Continue flecainide.  History of CVA: CT of the h magnesium level this morning is pending.ead and neck show no large vessel occlusion unchanged severe stenosis of the right PCA P1 segment and unchanged also on the left 2D echo EF of 65% with grade 1 diastolic heart failure no wall motion abnormality.  History of nonobstructive coronary artery disease status post cath in May 2010: Cont. aspirin.    History of pacemaker Cont. flecainide home dose.  BPH: Resume Proscar and Flomax.    DVT prophylaxis: lovenox Family Communication:daughter  Status is: Inpatient Remains inpatient appropriate because: Acute closed hip fracture      Code Status:     Code Status Orders  (From admission, onward)           Start     Ordered   05/11/21 2322  Full code  Continuous        05/11/21 2321           Code Status History     This patient has a current code status but no historical code status.      Advance Directive Documentation    [place] Most Recent Value  Type of Advance Directive Living will  Pre-existing out of facility DNR order (yellow form or pink MOST form) --  "MOST" Form in Place? --         IV Access:   Peripheral IV   Procedures and diagnostic studies:   DG C-Arm 1-60 Min-No Report  Result Date: 05/12/2021 Fluoroscopy was utilized by the requesting physician.  No radiographic interpretation.   DG C-Arm 1-60 Min-No Report  Result Date: 05/12/2021 Fluoroscopy  was utilized by the requesting physician.  No radiographic interpretation.   DG C-Arm 1-60 Min-No Report  Result Date: 05/12/2021 Fluoroscopy was utilized by the requesting physician.  No radiographic interpretation.   ECHOCARDIOGRAM COMPLETE  Result Date: 05/12/2021    ECHOCARDIOGRAM REPORT   Patient Name:   Marcus Mcgee Date of Exam: 05/12/2021 Medical Rec #:  [id number]    Height:       75.0 in Accession #:    [id number]    Weight:       200.0 lb Date of Birth:  [birth year]   BSA:          2.194 m Patient Age:    86 years     BP:           121/85 mmHg Patient Gender: M            HR:           77 bpm. Exam Location:  Inpatient Procedure: 2D Echo Indications:    TIA  History:        Patient has no prior history of Echocardiogram examinations.                 COPD and Stroke; Risk Factors:Hypertension.  Sonographer:    Devonne Doughty Referring Phys: [id number] Carlton Adam  Sonographer Comments: Technically difficult study due to poor echo windows. Image acquisition challenging due to patient body habitus, Image acquisition challenging due to respiratory motion and supine. IMPRESSIONS  1. Left ventricular ejection fraction, by estimation, is 65 to 70%. The left ventricle has normal function. The left ventricle has no regional wall motion abnormalities. There is mild left ventricular hypertrophy. Left ventricular diastolic parameters are consistent with Grade I diastolic dysfunction (impaired relaxation).  2. Right ventricular systolic function is normal. The right ventricular size is normal. There is normal pulmonary artery systolic pressure.  3. The mitral valve is normal in structure. Trivial mitral valve regurgitation.  4. The aortic valve is normal in structure. Aortic valve regurgitation is not visualized. FINDINGS  Left Ventricle: Left ventricular ejection fraction, by estimation, is 65 to 70%. The left ventricle has normal function. The left ventricle has no regional wall motion abnormalities. The left ventricular internal cavity size was normal in size. There is  mild left ventricular hypertrophy. Left ventricular diastolic parameters are consistent with Grade I diastolic dysfunction (impaired relaxation). Right Ventricle: The right ventricular size is normal. Right vetricular wall thickness was not well visualized. Right ventricular systolic function is normal. There is normal pulmonary artery systolic pressure. The tricuspid  regurgitant velocity is 2.47 m/s, and with an assumed right atrial pressure of 8 mmHg, the estimated right ventricular systolic pressure is 32.4 mmHg. Left Atrium: Left atrial size was normal in size. Right Atrium: Right atrial size was normal in size. Pericardium: There is no evidence of pericardial effusion. Mitral Valve: The mitral valve is normal in structure. Trivial mitral valve regurgitation. Tricuspid Valve: The tricuspid valve is normal in structure. Tricuspid valve regurgitation is trivial. Aortic Valve: The aortic valve is normal in structure. Aortic valve regurgitation is not visualized. Aortic valve mean gradient measures 2.0 mmHg. Aortic valve peak gradient measures 4.1 mmHg. Aortic valve area, by VTI measures 2.40 cm. Pulmonic Valve: The pulmonic valve was normal in structure. Pulmonic valve regurgitation is not visualized. Aorta: The aortic root and ascending aorta are structurally normal, with no evidence of dilitation. IAS/Shunts: The atrial septum is grossly normal.  LEFT VENTRICLE PLAX 2D LVIDd:  3.70 cm   Diastology LVIDs:         2.40 cm   LV e' medial:    5.33 cm/s LV PW:         1.10 cm   LV E/e' medial:  12.1 LV IVS:        1.10 cm   LV e' lateral:   8.38 cm/s LVOT diam:     2.00 cm   LV E/e' lateral: 7.7 LV SV:         48 LV SV Index:   22 LVOT Area:     3.14 cm  RIGHT VENTRICLE RV S prime:     11.90 cm/s TAPSE (M-mode): 1.7 cm LEFT ATRIUM             Index LA diam:        3.40 cm 1.55 cm/m LA Vol (A2C):   40.3 ml 18.37 ml/m LA Vol (A4C):   56.2 ml 25.61 ml/m LA Biplane Vol: 47.9 ml 21.83 ml/m  AORTIC VALVE AV Area (Vmax):    2.58 cm AV Area (Vmean):   2.56 cm AV Area (VTI):     2.40 cm AV Vmax:           101.00 cm/s AV Vmean:          67.900 cm/s AV VTI:            0.200 m AV Peak Grad:      4.1 mmHg AV Mean Grad:      2.0 mmHg LVOT Vmax:         82.90 cm/s LVOT Vmean:        55.300 cm/s LVOT VTI:          0.153 m LVOT/AV VTI ratio: 0.76  AORTA Ao Root diam: 3.70 cm Ao Asc  diam:  3.70 cm MITRAL VALVE               TRICUSPID VALVE MV Area (PHT): 1.98 cm    TR Peak grad:   24.4 mmHg MV Decel Time: 384 msec    TR Vmax:        247.00 cm/s MV E velocity: 64.50 cm/s MV A velocity: 98.50 cm/s  SHUNTS MV E/A ratio:  0.65        Systemic VTI:  0.15 m                            Systemic Diam: 2.00 cm Mertie Moores MD Electronically signed by Mertie Moores MD Signature Date/Time: 05/12/2021/11:44:18 AM    Final    DG FEMUR, MIN 2 VIEWS RIGHT  Result Date: 05/12/2021 CLINICAL DATA:  Fluoroscopic assistance for internal fixation of fracture of right femur EXAM: RIGHT FEMUR 2 VIEWS COMPARISON:  Study done on 05/11/2021 FINDINGS: There is interval reduction of comminuted displaced fracture in the mid and distal shaft of right femur with metallic sideplate and multiple surgical screws. There is marked improvement in alignment of fracture fragments. There is previous right hip and right knee arthroplasty. Fluoroscopic time was 4 minutes and 57 seconds. Radiation dose is 43.64 mGy. IMPRESSION: Fluoroscopic assistance was provided for reduction and internal fixation of severely comminuted fracture of mid and distal shaft of right femur. Electronically Signed   By: Elmer Picker M.D.   On: 05/12/2021 20:58   DG FEMUR PORT MIN 2 VIEWS LEFT  Result Date: 05/12/2021 CLINICAL DATA:  Encounter for post surgery. Per history given, likely incorrect side  obtained as the right side radiographs demonstrate recent postsurgical changes. EXAM: LEFT FEMUR PORTABLE 2 VIEWS COMPARISON:  None. FINDINGS: Total left hip arthroplasty. Total left knee arthroplasty. There is no evidence of fracture or other focal bone lesions. Soft tissues are unremarkable. IMPRESSION: 1. Total LEFT hip arthroplasty. 2. Total LEFT knee arthroplasty. 3.  No acute displaced fracture or dislocation. Electronically Signed   By: Iven Finn M.D.   On: 05/12/2021 22:19   DG FEMUR PORT, MIN 2 VIEWS RIGHT  Result Date:  05/12/2021 CLINICAL DATA:  Right femoral fracture repair EXAM: RIGHT FEMUR PORTABLE 2 VIEW COMPARISON:  05/11/2021 FINDINGS: Frontal and lateral views of the right femur are obtained. Lateral plate and screw fixation spans the comminuted distal right femoral fracture seen previously. Alignment is near anatomic. Unremarkable right hip and right knee arthroplasty. Postsurgical changes are seen in the soft tissues. IMPRESSION: 1. ORIF comminuted distal right femoral fracture, with near anatomic alignment. Electronically Signed   By: Randa Ngo M.D.   On: 05/12/2021 22:24     Medical Consultants:   None.   Subjective:    GINO SANTORA sleepy this morning slow to answer questions.  Objective:    Vitals:   05/13/21 1915 05/14/21 0621 05/14/21 0624 05/14/21 0645  BP: (!) 108/49  (!) 101/54 (!) 111/58  Pulse: 74     Resp: 13     Temp: 100.3 F (37.9 C) 99.5 F (37.5 C)  99.7 F (37.6 C)  TempSrc: Axillary Oral  Oral  SpO2: 96%     Weight:      Height:       SpO2: 96 % O2 Flow Rate (L/min): 2 L/min   Intake/Output Summary (Last 24 hours) at 05/14/2021 0723 Last data filed at 05/13/2021 1959 Gross per 24 hour  Intake 1007.96 ml  Output --  Net 1007.96 ml    Filed Weights   05/11/21 2152  Weight: 90.7 kg    Exam: General exam: In no acute distress. Respiratory system: Good air movement and clear to auscultation. Cardiovascular system: S1 & S2 heard, RRR. No JVD. Gastrointestinal system: Abdomen is nondistended, soft and nontender.  Extremities: No pedal edema. Skin: No rashes, lesions or ulcers  Data Reviewed:    Labs: Basic Metabolic Panel: Recent Labs  Lab 05/11/21 0020 05/11/21 2140 05/12/21 0544 05/12/21 1957 05/13/21 0800 05/14/21 0159  NA 134* 133* 133* 134*  --  131*  K 5.0 4.8 5.1 4.1  --  4.2  CL 98 100 100 98  --  99  CO2 25  --  24  --   --  23  GLUCOSE 127* 128* 142* 127*  --  144*  BUN 18 20 23 23   --  18  CREATININE 1.25* 1.10 1.46* 1.30*  --   1.13  CALCIUM 9.0  --  8.9  --   --  7.9*  MG  --   --   --   --  1.4*  --     GFR Estimated Creatinine Clearance: 58.2 mL/min (by C-G formula based on SCr of 1.13 mg/dL). Liver Function Tests: Recent Labs  Lab 05/11/21 0020  AST 27  ALT 16  ALKPHOS 59  BILITOT 0.3  PROT 5.7*  ALBUMIN 3.6    No results for input(s): LIPASE, AMYLASE in the last 168 hours. No results for input(s): AMMONIA in the last 168 hours. Coagulation profile Recent Labs  Lab 05/11/21 2132  INR 1.0    COVID-19 Labs  No results  for input(s): DDIMER, FERRITIN, LDH, CRP in the last 72 hours.  Lab Results  Component Value Date   [place] NEGATIVE 05/11/2021    CBC: Recent Labs  Lab 05/11/21 0020 05/11/21 2140 05/12/21 0544 05/12/21 1957 05/14/21 0159  WBC 9.1  --  12.1*  --   --   HGB 13.2 13.6 10.5* 9.2* 5.9*  HCT 39.4 40.0 29.8* 27.0* 17.3*  MCV 91.0  --  86.4  --   --   PLT 202  --  228  --   --     Cardiac Enzymes: No results for input(s): CKTOTAL, CKMB, CKMBINDEX, TROPONINI in the last 168 hours. BNP (last 3 results) No results for input(s): PROBNP in the last 8760 hours. CBG: No results for input(s): GLUCAP in the last 168 hours. D-Dimer: No results for input(s): DDIMER in the last 72 hours. Hgb A1c: No results for input(s): HGBA1C in the last 72 hours. Lipid Profile: No results for input(s): CHOL, HDL, LDLCALC, TRIG, CHOLHDL, LDLDIRECT in the last 72 hours. Thyroid function studies: No results for input(s): TSH, T4TOTAL, T3FREE, THYROIDAB in the last 72 hours.  Invalid input(s): FREET3 Anemia work up: No results for input(s): VITAMINB12, FOLATE, FERRITIN, TIBC, IRON, RETICCTPCT in the last 72 hours. Sepsis Labs: Recent Labs  Lab 05/11/21 0020 05/11/21 2132 05/12/21 0544  WBC 9.1  --  12.1*  LATICACIDVEN  --  3.7*  --     Microbiology Recent Results (from the past 240 hour(s))  Resp Panel by RT-PCR (Flu A&B, Covid) Nasopharyngeal Swab     Status: None    Collection Time: 05/11/21  9:32 PM   Specimen: Nasopharyngeal Swab; Nasopharyngeal(NP) swabs in vial transport medium  Result Value Ref Range Status   SARS Coronavirus 2 by RT PCR NEGATIVE NEGATIVE Final    Comment: (NOTE) SARS-CoV-2 target nucleic acids are NOT DETECTED.  The SARS-CoV-2 RNA is generally detectable in upper respiratory specimens during the acute phase of infection. The lowest concentration of SARS-CoV-2 viral copies this assay can detect is 138 copies/mL. A negative result does not preclude SARS-Cov-2 infection and should not be used as the sole basis for treatment or other patient management decisions. A negative result may occur with  improper specimen collection/handling, submission of specimen other than nasopharyngeal swab, presence of viral mutation(s) within the areas targeted by this assay, and inadequate number of viral copies(<138 copies/mL). A negative result must be combined with clinical observations, patient history, and epidemiological information. The expected result is Negative.  Fact Sheet for Patients:  EntrepreneurPulse.com.au  Fact Sheet for Healthcare Providers:  IncredibleEmployment.be  This test is no t yet approved or cleared by the [place] FDA and  has been authorized for detection and/or diagnosis of SARS-CoV-2 by FDA under an Emergency Use Authorization (EUA). This EUA will remain  in effect (meaning this test can be used) for the duration of the COVID-19 declaration under Section 564(b)(1) of the Act, 21 U.S.C.section 360bbb-3(b)(1), unless the authorization is terminated  or revoked sooner.       Influenza A by PCR NEGATIVE NEGATIVE Final   Influenza B by PCR NEGATIVE NEGATIVE Final    Comment: (NOTE) The Xpert Xpress SARS-CoV-2/FLU/RSV plus assay is intended as an aid in the diagnosis of influenza from Nasopharyngeal swab specimens and should not be used as a sole basis for treatment.  Nasal washings and aspirates are unacceptable for Xpert Xpress SARS-CoV-2/FLU/RSV testing.  Fact Sheet for Patients: EntrepreneurPulse.com.au  Fact Sheet for Healthcare Providers: IncredibleEmployment.be  This  test is not yet approved or cleared by the [place] and has been authorized for detection and/or diagnosis of SARS-CoV-2 by FDA under an Emergency Use Authorization (EUA). This EUA will remain in effect (meaning this test can be used) for the duration of the COVID-19 declaration under Section 564(b)(1) of the Act, 21 U.S.C. section 360bbb-3(b)(1), unless the authorization is terminated or revoked.  Performed at [facility], [place] [address], [place], [place] [address]      Medications:    sodium chloride   Intravenous Once   sodium chloride   Intravenous Once   aspirin EC  325 mg Oral q morning   atorvastatin  40 mg Oral QHS   enoxaparin (LOVENOX) injection  40 mg Subcutaneous Q24H   finasteride  5 mg Oral QHS   flecainide  100 mg Oral BID   pantoprazole  40 mg Oral Daily   polyethylene glycol  17 g Oral BID   pregabalin  200 mg Oral BID   sertraline  100 mg Oral q morning   traZODone  200 mg Oral QHS   Continuous Infusions:  methocarbamol (ROBAXIN) IV Stopped (05/12/21 1121)      LOS: 3 days   Charlynne Cousins  [facility]  05/14/2021, 7:23 AM

## 2021-05-14 NOTE — Evaluation (Signed)
Occupational Therapy Evaluation Patient Details Name: Marcus Mcgee MRN: [id number] DOB: 08-03-1934 Today's Date: 05/14/2021   History of Present Illness 87 yo admitted 2/28 after fall at home with Rt femur fx s/p ORIF 3/1. PMhx: bil TKA, bil THA, HTN, cOPD, CVA   Clinical Impression   Pt was ambulating independently prior to admission, although his wife reports he should have been using a RW. He was assisted for LB ADLs and IADLs. Pt presents with disorientation and confusion, generalized weakness and R LE pain. He requires 2 person assist for bed level mobility and is unable to stand or maintain NWB status. Pt is currently NPO and dependent in all ADLs. Supportive family is at bedside and is agreeable to pt going to rehab in SNF. Will follow acutely.      Recommendations for follow up therapy are one component of a multi-disciplinary discharge planning process, led by the attending physician.  Recommendations may be updated based on patient status, additional functional criteria and insurance authorization.   Follow Up Recommendations  Skilled nursing-short term rehab (<3 hours/day)    Assistance Recommended at Discharge Frequent or constant Supervision/Assistance  Patient can return home with the following Two people to help with walking and/or transfers;A lot of help with bathing/dressing/bathroom;Assistance with feeding;Assistance with cooking/housework;Direct supervision/assist for medications management;Direct supervision/assist for financial management;Assist for transportation;Help with stairs or ramp for entrance    Functional Status Assessment  Patient has had a recent decline in their functional status and/or demonstrates limited ability to make significant improvements in function in a reasonable and predictable amount of time  Equipment Recommendations  Other (comment) (defer to next venue)    Recommendations for Other Services       Precautions / Restrictions  Precautions Precautions: Fall Restrictions Weight Bearing Restrictions: Yes RLE Weight Bearing: Non weight bearing      Mobility Bed Mobility Overal bed mobility: Needs Assistance Bed Mobility: Supine to Sit, Sit to Supine     Supine to sit: +2 for physical assistance, HOB elevated, Max assist Sit to supine: +2 for physical assistance, Total assist   General bed mobility comments: initiated lift trunk, assist for LEs over EOB and to raise trunk, guided trunk and assisted LEs back into bed, +2 total to pull up in bed    Transfers Overall transfer level: Needs assistance                 General transfer comment: pt unable to clear buttocks from elevated or maintain NWB on R LE      Balance Overall balance assessment: Needs assistance Sitting-balance support: No upper extremity supported Sitting balance-Leahy Scale: Fair                                     ADL either performed or assessed with clinical judgement   ADL                                         General ADL Comments: NPO, total assist     Vision Ability to See in Adequate Light: 0 Adequate       Perception     Praxis      Pertinent Vitals/Pain Pain Assessment Pain Assessment: Faces Faces Pain Scale: Hurts even more Pain Location: R LE Pain Descriptors / Indicators: Grimacing, Guarding, Sore  Pain Intervention(s): Monitored during session, Repositioned     Hand Dominance Right   Extremity/Trunk Assessment Upper Extremity Assessment Upper Extremity Assessment: Generalized weakness;Difficult to assess due to impaired cognition   Lower Extremity Assessment Lower Extremity Assessment: Defer to PT evaluation   Cervical / Trunk Assessment Cervical / Trunk Assessment: Other exceptions Cervical / Trunk Exceptions: rounded shoulders, forward head   Communication Communication Communication: HOH (has hearing aids)   Cognition Arousal/Alertness:  Awake/alert Behavior During Therapy: Flat affect Overall Cognitive Status: Impaired/Different from baseline Area of Impairment: Orientation, Attention, Memory, Following commands, Safety/judgement, Awareness, Problem solving                 Orientation Level: Disoriented to, Place, Time, Situation Current Attention Level: Focused Memory: Decreased recall of precautions, Decreased short-term memory Following Commands: Follows one step commands inconsistently Safety/Judgement: Decreased awareness of safety, Decreased awareness of deficits Awareness: Intellectual Problem Solving: Slow processing, Decreased initiation, Difficulty sequencing, Requires verbal cues, Requires tactile cues General Comments: wife reports pt's cognition has worsened since yesterday     General Comments       Exercises     Shoulder Instructions      Home Living Family/patient expects to be discharged to:: Private residence Living Arrangements: Spouse/significant other Available Help at Discharge: Available 24 hours/day Type of Home: House Home Access: Other (comment);Level entry (lift also)     Home Layout: One level     Bathroom Shower/Tub: Walk-in shower         Home Equipment: Agricultural consultant (2 wheels);Cane - single point;Tub bench;BSC/3in1;Grab bars - toilet;Grab bars - tub/shower;Wheelchair - manual;Hospital bed;Hand held Stage manager (4 wheels);Other (comment)   Additional Comments: lift chair      Prior Functioning/Environment Prior Level of Function : Needs assist             Mobility Comments: uses lift into house, was told to use RW but refuses and has had multiple falls ADLs Comments: wife assists with lower body dressing. Wife handles homemaking and medications, doesn't drive        OT Problem List: Decreased strength;Decreased activity tolerance;Impaired balance (sitting and/or standing);Decreased cognition;Decreased safety awareness;Decreased knowledge of use  of DME or AE;Pain      OT Treatment/Interventions: Self-care/ADL training;DME and/or AE instruction;Cognitive remediation/compensation;Therapeutic activities;Patient/family education;Balance training    OT Goals(Current goals can be found in the care plan section) Acute Rehab OT Goals OT Goal Formulation: Patient unable to participate in goal setting Time For Goal Achievement: 05/28/21 Potential to Achieve Goals: Good ADL Goals Pt Will Perform Eating: with min assist;sitting;bed level Pt Will Perform Grooming: with min assist;sitting Pt Will Perform Upper Body Bathing: sitting;with min assist Pt Will Perform Upper Body Dressing: with supervision;sitting Pt Will Transfer to Toilet: with +2 assist;with min assist;stand pivot transfer;bedside commode Additional ADL Goal #1: Pt will perform bed mobility with moderate assistance in preparation for ADLs. Additional ADL Goal #2: Pt will follow one step commands with 50 % accuracy.  OT Frequency: Min 2X/week    Co-evaluation              AM-PAC OT "6 Clicks" Daily Activity     Outcome Measure Help from another person eating meals?: Total Help from another person taking care of personal grooming?: Total Help from another person toileting, which includes using toliet, bedpan, or urinal?: Total Help from another person bathing (including washing, rinsing, drying)?: Total Help from another person to put on and taking off regular upper body clothing?: Total Help from another  person to put on and taking off regular lower body clothing?: Total 6 Click Score: 6   End of Session Nurse Communication:  (pt had already received blood transfusion)  Activity Tolerance: Patient limited by pain Patient left: in bed;with call bell/phone within reach;with bed alarm set;with family/visitor present  OT Visit Diagnosis: Pain;Muscle weakness (generalized) (M62.81);Other symptoms and signs involving cognitive function                Time: 1310-1332 OT  Time Calculation (min): 22 min Charges:  OT General Charges $OT Visit: 1 Visit OT Evaluation $OT Eval Moderate Complexity: 1 Mod Martie Round, OTR/L Acute Rehabilitation Services Pager: [phone] Office: [phone]  Evern Bio 05/14/2021, 2:39 PM

## 2021-05-14 NOTE — Progress Notes (Signed)
? ?  Subjective: ? ?Patient reports pain as mild.  Tolerating diet. Receiving blood this AM for blood loss anemia. ? ?Objective:  ? ?VITALS:   ?Vitals:  ? 05/13/21 0340 05/13/21 1252 05/13/21 1915 05/14/21 [id number]  ?BP: 124/61  (!) 108/49   ?Pulse: 81  74   ?Resp: 14  13   ?Temp: 98 ?F (36.7 ?C) 97.6 ?F (36.4 ?C) 100.3 ?F (37.9 ?C) 99.5 ?F (37.5 ?C)  ?TempSrc: Axillary Oral Axillary Oral  ?SpO2: 97%  96%   ?Weight:      ?Height:      ? ? ?Dressing over right lower extremity is clean dry intact.  Is able to fire extensors of all of his toes as well as the flexors of the right foot.  Sensation is intact in all distributions of the right foot.  2+ dorsalis pedis pulse ? ? ?Lab Results  ?Component Value Date  ? WBC 12.1 (H) 05/12/2021  ? HGB 5.9 (LL) 05/14/2021  ? HCT 17.3 (L) 05/14/2021  ? MCV 86.4 05/12/2021  ? PLT 228 05/12/2021  ? ? ? ?Assessment/Plan: ? ?2 Days Post-Op status post right femur open reduction internal fixation, doing well ? ?- Expected postop acute blood loss anemia - agree with transfusion ?- Patient to work with PT/OT to optimize mobilization safely ?- DVT ppx - SCDs, ambulation, Lovenox ?- Postoperative Abx: Ancef x 2 additional doses given ?- NWB operative extremity ?- Pain control - multimodal pain management, ATC acetaminophen in conjunction with as needed narcotic (oxycodone), although this should be minimized with other modalities  ?- Discharge planning pending CM, appreciate coordination  ? ? ?Vanetta Mulders ?05/14/2021, 6:40 AM ? ?

## 2021-05-15 DIAGNOSIS — Z950 Presence of cardiac pacemaker: Secondary | ICD-10-CM

## 2021-05-15 DIAGNOSIS — I10 Essential (primary) hypertension: Secondary | ICD-10-CM | POA: Diagnosis not present

## 2021-05-15 DIAGNOSIS — J410 Simple chronic bronchitis: Secondary | ICD-10-CM | POA: Diagnosis not present

## 2021-05-15 DIAGNOSIS — Z8673 Personal history of transient ischemic attack (TIA), and cerebral infarction without residual deficits: Secondary | ICD-10-CM | POA: Diagnosis not present

## 2021-05-15 DIAGNOSIS — S7291XA Unspecified fracture of right femur, initial encounter for closed fracture: Secondary | ICD-10-CM | POA: Diagnosis not present

## 2021-05-15 LAB — BASIC METABOLIC PANEL
Anion gap: 7 (ref 5–15)
BUN: 17 mg/dL (ref 8–23)
CO2: 24 mmol/L (ref 22–32)
Calcium: 8 mg/dL — ABNORMAL LOW (ref 8.9–10.3)
Chloride: 103 mmol/L (ref 98–111)
Creatinine, Ser: 0.96 mg/dL (ref 0.61–1.24)
GFR, Estimated: >60 mL/min (ref >60)
Glucose, Bld: 130 mg/dL — ABNORMAL HIGH (ref 70–99)
Potassium: 3.6 mmol/L (ref 3.5–5.1)
Sodium: 134 mmol/L — ABNORMAL LOW (ref 135–145)

## 2021-05-15 LAB — CBC WITH DIFFERENTIAL/PLATELET
Abs Immature Granulocytes: 0.05 10*3/uL (ref 0.00–0.07)
Basophils Absolute: 0 10*3/uL (ref 0.0–0.1)
Basophils Relative: 0 %
Eosinophils Absolute: 0 10*3/uL (ref 0.0–0.5)
Eosinophils Relative: 0 %
HCT: 19.4 % — ABNORMAL LOW (ref 39.0–52.0)
Hemoglobin: 6.8 g/dL — CL (ref 13.0–17.0)
Immature Granulocytes: 1 %
Lymphocytes Relative: 10 %
Lymphs Abs: 0.9 10*3/uL (ref 0.7–4.0)
MCH: 31.3 pg (ref 26.0–34.0)
MCHC: 35.1 g/dL (ref 30.0–36.0)
MCV: 89.4 fL (ref 80.0–100.0)
Monocytes Absolute: 0.5 10*3/uL (ref 0.1–1.0)
Monocytes Relative: 6 %
Neutro Abs: 7.4 10*3/uL (ref 1.7–7.7)
Neutrophils Relative %: 83 %
Platelets: 155 10*3/uL (ref 150–400)
RBC: 2.17 MIL/uL — ABNORMAL LOW (ref 4.22–5.81)
RDW: 13.8 % (ref 11.5–15.5)
WBC: 8.9 10*3/uL (ref 4.0–10.5)
nRBC: 0 % (ref 0.0–0.2)

## 2021-05-15 LAB — PREPARE RBC (CROSSMATCH)

## 2021-05-15 LAB — CBC
HCT: 22.9 % — ABNORMAL LOW (ref 39.0–52.0)
Hemoglobin: 7.7 g/dL — ABNORMAL LOW (ref 13.0–17.0)
MCH: 29.8 pg (ref 26.0–34.0)
MCHC: 33.6 g/dL (ref 30.0–36.0)
MCV: 88.8 fL (ref 80.0–100.0)
Platelets: 166 10*3/uL (ref 150–400)
RBC: 2.58 MIL/uL — ABNORMAL LOW (ref 4.22–5.81)
RDW: 14.4 % (ref 11.5–15.5)
WBC: 9.7 10*3/uL (ref 4.0–10.5)
nRBC: 0 % (ref 0.0–0.2)

## 2021-05-15 LAB — MAGNESIUM: Magnesium: 1.8 mg/dL (ref 1.7–2.4)

## 2021-05-15 MED ORDER — SODIUM CHLORIDE 0.9% IV SOLUTION: Freq: Once | INTRAVENOUS | Status: AC

## 2021-05-15 MED ORDER — MORPHINE SULFATE (PF) 2 MG/ML IV SOLN: 2.0000 mg | INTRAVENOUS | Status: DC | PRN

## 2021-05-15 NOTE — Progress Notes (Signed)
[facility] PROGRESS NOTE    Marcus Mcgee  [id number] DOB: 19-Feb-1935 DOA: 05/11/2021 PCP: Dineen Kid, MD     HPI Marcus Mcgee is an 87 y.o. male past medical history significant for HTN, pacemaker with history arrhythmia, history of CVA who presents by EMS after a fall after this he noticed deformity of his right thigh EMS was called according to his wife he has been having difficulty with balance and has had couple of falls with no injuries.  He saw neurologist the day prior to admission as he was having memory difficulties.  Family relates he has been falling for the last several months. Orthopedic surgery was consulted, had ORIF on 05/12/21.    Assessment/plan  Closed fracture of right femur, initial encounter [facility]): Orthopedic surgery consulted, status post ORIF of the distal femur on 05/12/2021 Pain management, DVT PPx per orthopedics (holding narcotics) Continue ASA 325 mg as DVT ppx PT/OT- SNF  Acute blood loss anemia Postop surgical hemoglobin 5.9, transfused 2 units of PRBC--> 7-->6.8 transfused 1U of PRBC As per orthopedics, expected blood loss Frequent CBC  Acute metabolic encephalopathy Improving Likely due to Lyrica, trazodone, narcotics, severe anemia plus or minus anesthetic during the surgery, underlying ?dementia, hospital delirium Monitor closely, delirium precautions  Acute kidney injury Resolved Likely prerenal azotemia  Essential hypertension Stable Continue IV hydralazine as needed  COPD Stable, continue inhalers  Prolonged QTc Continue flecainide  History of CVA CTA head and neck with no large vessel occlusion, unchanged severe stenosis of the right PCA P1 segment and unchanged also on the left 2D echo EF of 65% with grade 1 diastolic heart failure no wall motion abnormality.  History of nonobstructive CAD Cath in May 2010 Cont. aspirin  History of pacemaker  BPH Proscar and Flomax.    DVT prophylaxis: ASA as per  ortho Family Communication: Daughter  Status is: Inpatient Remains inpatient appropriate because: Level of care      Code Status:     Code Status Orders  (From admission, onward)           Start     Ordered   05/11/21 2322  Full code  Continuous        05/11/21 2321           Code Status History     This patient has a current code status but no historical code status.      Advance Directive Documentation    [place] Most Recent Value  Type of Advance Directive Living will  Pre-existing out of facility DNR order (yellow form or pink MOST form) --  "MOST" Form in Place? --         IV Access:   Peripheral IV   Procedures and diagnostic studies:   No results found.   Medical Consultants:   Orthopedics   Subjective:    Marcus Mcgee noted to be more awake, able to engage in conversations, does not want to go to rehab/SNF   Objective:    Vitals:   05/15/21 1205 05/15/21 1209 05/15/21 1210 05/15/21 1215  BP: 126/62 131/61 131/61 (!) 121/50  Pulse: 91 76 76 84  Resp: 17 18 15 13   Temp: 98.6 F (37 C) 98.6 F (37 C) 98.6 F (37 C) 98.6 F (37 C)  TempSrc: Oral     SpO2: 98%  99% 100%  Weight:      Height:       SpO2: 100 % O2 Flow Rate (L/min):  2 L/min   Intake/Output Summary (Last 24 hours) at 05/15/2021 1517 Last data filed at 05/15/2021 0308 Gross per 24 hour  Intake 1111.34 ml  Output 1550 ml  Net -438.66 ml   Filed Weights   05/11/21 2152  Weight: 90.7 kg    Exam: General: NAD, awake, alert Cardiovascular: S1, S2 present Respiratory: CTAB Abdomen: Soft, nontender, nondistended, bowel sounds present Musculoskeletal: No bilateral pedal edema noted, R dressing c/d/i Skin: Normal Psychiatry: Normal mood   Data Reviewed:    Labs: Basic Metabolic Panel: Recent Labs  Lab 05/11/21 0020 05/11/21 2140 05/12/21 0544 05/12/21 1957 05/13/21 0800 05/14/21 0159 05/15/21 0808 05/15/21 1019  NA 134* 133* 133*  134*  --  131* 134*  --   K 5.0 4.8 5.1 4.1  --  4.2 3.6  --   CL 98 100 100 98  --  99 103  --   CO2 25  --  24  --   --  23 24  --   GLUCOSE 127* 128* 142* 127*  --  144* 130*  --   BUN 18 20 23 23   --  18 17  --   CREATININE 1.25* 1.10 1.46* 1.30*  --  1.13 0.96  --   CALCIUM 9.0  --  8.9  --   --  7.9* 8.0*  --   MG  --   --   --   --  1.4* 1.6*  --  1.8   GFR Estimated Creatinine Clearance: 68.5 mL/min (by C-G formula based on SCr of 0.96 mg/dL). Liver Function Tests: Recent Labs  Lab 05/11/21 0020  AST 27  ALT 16  ALKPHOS 59  BILITOT 0.3  PROT 5.7*  ALBUMIN 3.6   No results for input(s): LIPASE, AMYLASE in the last 168 hours. No results for input(s): AMMONIA in the last 168 hours. Coagulation profile Recent Labs  Lab 05/11/21 2132  INR 1.0   COVID-19 Labs  No results for input(s): DDIMER, FERRITIN, LDH, CRP in the last 72 hours.  Lab Results  Component Value Date   [place] NEGATIVE 05/11/2021    CBC: Recent Labs  Lab 05/11/21 0020 05/11/21 2140 05/12/21 0544 05/12/21 1957 05/14/21 0159 05/14/21 1856 05/15/21 0808  WBC 9.1  --  12.1*  --   --   --  8.9  NEUTROABS  --   --   --   --   --   --  7.4  HGB 13.2   < > 10.5* 9.2* 5.9* 7.0* 6.8*  HCT 39.4   < > 29.8* 27.0* 17.3* 20.1* 19.4*  MCV 91.0  --  86.4  --   --   --  89.4  PLT 202  --  228  --   --   --  155   < > = values in this interval not displayed.   Cardiac Enzymes: No results for input(s): CKTOTAL, CKMB, CKMBINDEX, TROPONINI in the last 168 hours. BNP (last 3 results) No results for input(s): PROBNP in the last 8760 hours. CBG: No results for input(s): GLUCAP in the last 168 hours. D-Dimer: No results for input(s): DDIMER in the last 72 hours. Hgb A1c: No results for input(s): HGBA1C in the last 72 hours. Lipid Profile: No results for input(s): CHOL, HDL, LDLCALC, TRIG, CHOLHDL, LDLDIRECT in the last 72 hours. Thyroid function studies: No results for input(s): TSH, T4TOTAL,  T3FREE, THYROIDAB in the last 72 hours.  Invalid input(s): FREET3 Anemia work up: No results for input(s):  VITAMINB12, FOLATE, FERRITIN, TIBC, IRON, RETICCTPCT in the last 72 hours. Sepsis Labs: Recent Labs  Lab 05/11/21 0020 05/11/21 2132 05/12/21 0544 05/15/21 0808  WBC 9.1  --  12.1* 8.9  LATICACIDVEN  --  3.7*  --   --    Microbiology Recent Results (from the past 240 hour(s))  Resp Panel by RT-PCR (Flu A&B, Covid) Nasopharyngeal Swab     Status: None   Collection Time: 05/11/21  9:32 PM   Specimen: Nasopharyngeal Swab; Nasopharyngeal(NP) swabs in vial transport medium  Result Value Ref Range Status   SARS Coronavirus 2 by RT PCR NEGATIVE NEGATIVE Final    Comment: (NOTE) SARS-CoV-2 target nucleic acids are NOT DETECTED.  The SARS-CoV-2 RNA is generally detectable in upper respiratory specimens during the acute phase of infection. The lowest concentration of SARS-CoV-2 viral copies this assay can detect is 138 copies/mL. A negative result does not preclude SARS-Cov-2 infection and should not be used as the sole basis for treatment or other patient management decisions. A negative result may occur with  improper specimen collection/handling, submission of specimen other than nasopharyngeal swab, presence of viral mutation(s) within the areas targeted by this assay, and inadequate number of viral copies(<138 copies/mL). A negative result must be combined with clinical observations, patient history, and epidemiological information. The expected result is Negative.  Fact Sheet for Patients:  EntrepreneurPulse.com.au  Fact Sheet for Healthcare Providers:  IncredibleEmployment.be  This test is no t yet approved or cleared by the [place] FDA and  has been authorized for detection and/or diagnosis of SARS-CoV-2 by FDA under an Emergency Use Authorization (EUA). This EUA will remain  in effect (meaning this test can be used) for the  duration of the COVID-19 declaration under Section 564(b)(1) of the Act, 21 U.S.C.section 360bbb-3(b)(1), unless the authorization is terminated  or revoked sooner.       Influenza A by PCR NEGATIVE NEGATIVE Final   Influenza B by PCR NEGATIVE NEGATIVE Final    Comment: (NOTE) The Xpert Xpress SARS-CoV-2/FLU/RSV plus assay is intended as an aid in the diagnosis of influenza from Nasopharyngeal swab specimens and should not be used as a sole basis for treatment. Nasal washings and aspirates are unacceptable for Xpert Xpress SARS-CoV-2/FLU/RSV testing.  Fact Sheet for Patients: EntrepreneurPulse.com.au  Fact Sheet for Healthcare Providers: IncredibleEmployment.be  This test is not yet approved or cleared by the [place] FDA and has been authorized for detection and/or diagnosis of SARS-CoV-2 by FDA under an Emergency Use Authorization (EUA). This EUA will remain in effect (meaning this test can be used) for the duration of the COVID-19 declaration under Section 564(b)(1) of the Act, 21 U.S.C. section 360bbb-3(b)(1), unless the authorization is terminated or revoked.  Performed at [facility], [place] [address] [place], [place] [address]      Medications:    aspirin  300 mg Rectal Daily   Or   aspirin EC  325 mg Oral Daily   atorvastatin  40 mg Oral QHS   finasteride  5 mg Oral QHS   flecainide  100 mg Oral BID   pantoprazole (PROTONIX) IV  40 mg Intravenous Daily   Or   pantoprazole  40 mg Oral Daily   sertraline  100 mg Oral q morning   Continuous Infusions:      LOS: 4 days   Alma Friendly, MD  [facility]  05/15/2021, 3:17 PM

## 2021-05-15 NOTE — Progress Notes (Signed)
? ?  Subjective: ? ?Patient reports pain as mild.  Status post transfusion. In good spirits. Denies any lightheadedness ? ?Objective:  ? ?VITALS:   ?Vitals:  ? 05/14/21 0930 05/14/21 1549 05/14/21 2106 05/15/21 0426  ?BP: (!) 97/55 (!) 137/45 95/82 (!) 111/93  ?Pulse: 74 76 79 80  ?Resp: 15 11 15 16   ?Temp: 98.6 ?F (37 ?C)  98.9 ?F (37.2 ?C)   ?TempSrc: Oral  Oral   ?SpO2: 95% 97% 98% 100%  ?Weight:      ?Height:      ? ? ?Dressing over right lower extremity is clean dry intact.  Is able to fire extensors of all of his toes as well as the flexors of the right foot.  Sensation is intact in all distributions of the right foot.  2+ dorsalis pedis pulse ? ? ?Lab Results  ?Component Value Date  ? WBC 12.1 (H) 05/12/2021  ? HGB 7.0 (L) 05/14/2021  ? HCT 20.1 (L) 05/14/2021  ? MCV 86.4 05/12/2021  ? PLT 228 05/12/2021  ? ? ? ?Assessment/Plan: ? ?3 Days Post-Op status post right femur open reduction internal fixation, doing well ? ?- Expected postop acute blood loss anemia - agree with transfusion ?- Patient to work with PT/OT to optimize mobilization safely ?- DVT ppx - SCDs, ambulation, Aspirin ?- Postoperative Abx: Ancef x 2 additional doses given ?- NWB operative extremity ?- Pain control - multimodal pain management, ATC acetaminophen in conjunction with as needed narcotic (oxycodone), although this should be minimized with other modalities  ?- Discharge planning pending CM, appreciate coordination  ? ? ?Vanetta Mulders ?05/15/2021, 7:48 AM ? ?

## 2021-05-16 DIAGNOSIS — S7291XA Unspecified fracture of right femur, initial encounter for closed fracture: Secondary | ICD-10-CM | POA: Diagnosis not present

## 2021-05-16 DIAGNOSIS — Z950 Presence of cardiac pacemaker: Secondary | ICD-10-CM | POA: Diagnosis not present

## 2021-05-16 DIAGNOSIS — I10 Essential (primary) hypertension: Secondary | ICD-10-CM | POA: Diagnosis not present

## 2021-05-16 DIAGNOSIS — Z8673 Personal history of transient ischemic attack (TIA), and cerebral infarction without residual deficits: Secondary | ICD-10-CM | POA: Diagnosis not present

## 2021-05-16 LAB — TYPE AND SCREEN
ABO/RH(D): O POS
Antibody Screen: NEGATIVE
Unit division: 0
Unit division: 0

## 2021-05-16 LAB — CBC WITH DIFFERENTIAL/PLATELET
Abs Immature Granulocytes: 0.04 10*3/uL (ref 0.00–0.07)
Basophils Absolute: 0 10*3/uL (ref 0.0–0.1)
Basophils Relative: 0 %
Eosinophils Absolute: 0 10*3/uL (ref 0.0–0.5)
Eosinophils Relative: 0 %
HCT: 24.1 % — ABNORMAL LOW (ref 39.0–52.0)
Hemoglobin: 8.2 g/dL — ABNORMAL LOW (ref 13.0–17.0)
Immature Granulocytes: 0 %
Lymphocytes Relative: 12 %
Lymphs Abs: 1.1 10*3/uL (ref 0.7–4.0)
MCH: 30.3 pg (ref 26.0–34.0)
MCHC: 34 g/dL (ref 30.0–36.0)
MCV: 88.9 fL (ref 80.0–100.0)
Monocytes Absolute: 0.5 10*3/uL (ref 0.1–1.0)
Monocytes Relative: 6 %
Neutro Abs: 7.2 10*3/uL (ref 1.7–7.7)
Neutrophils Relative %: 82 %
Platelets: 183 10*3/uL (ref 150–400)
RBC: 2.71 MIL/uL — ABNORMAL LOW (ref 4.22–5.81)
RDW: 14.5 % (ref 11.5–15.5)
WBC: 8.9 10*3/uL (ref 4.0–10.5)
nRBC: 0 % (ref 0.0–0.2)

## 2021-05-16 LAB — BASIC METABOLIC PANEL
Anion gap: 9 (ref 5–15)
BUN: 16 mg/dL (ref 8–23)
CO2: 23 mmol/L (ref 22–32)
Calcium: 8.1 mg/dL — ABNORMAL LOW (ref 8.9–10.3)
Chloride: 102 mmol/L (ref 98–111)
Creatinine, Ser: 0.85 mg/dL (ref 0.61–1.24)
GFR, Estimated: >60 mL/min (ref >60)
Glucose, Bld: 130 mg/dL — ABNORMAL HIGH (ref 70–99)
Potassium: 3.4 mmol/L — ABNORMAL LOW (ref 3.5–5.1)
Sodium: 134 mmol/L — ABNORMAL LOW (ref 135–145)

## 2021-05-16 LAB — BPAM RBC
Blood Product Expiration Date: 202304032359
Blood Product Expiration Date: 202304052359
ISSUE DATE / TIME: 202303030612
ISSUE DATE / TIME: 202303041142
Unit Type and Rh: 5100
Unit Type and Rh: 5100

## 2021-05-16 MED ORDER — POTASSIUM CHLORIDE CRYS ER 20 MEQ PO TBCR
40.0000 meq | EXTENDED_RELEASE_TABLET | Freq: Once | ORAL | Status: AC
  Administered 2021-05-16: 40 meq via ORAL
  Filled 2021-05-16: qty 2

## 2021-05-16 NOTE — Progress Notes (Signed)
[facility] PROGRESS NOTE    Marcus Mcgee  [id number] DOB: 1934-03-29 DOA: 05/11/2021 PCP: Dineen Kid, MD     HPI Marcus Mcgee is an 87 y.o. male past medical history significant for HTN, pacemaker with history arrhythmia, history of CVA who presents by EMS after a fall after this he noticed deformity of his right thigh EMS was called according to his wife he has been having difficulty with balance and has had couple of falls with no injuries.  He saw neurologist the day prior to admission as he was having memory difficulties.  Family relates he has been falling for the last several months. Orthopedic surgery was consulted, had ORIF on 05/12/21.    Assessment/plan  Closed fracture of right femur, initial encounter [facility]): Orthopedic surgery consulted, status post ORIF of the distal femur on 05/12/2021 Pain management, DVT PPx per orthopedics (holding narcotics) Continue ASA 325 mg as DVT ppx PT/OT- SNF  Acute blood loss anemia Postop surgical hemoglobin 5.9, transfused 2 units of PRBC--> 7-->6.8 transfused 1U of PRBC on 05/15/21-->8.2 As per orthopedics, expected blood loss Daily CBC  Acute metabolic encephalopathy Improving Likely due to Lyrica, trazodone, narcotics, severe anemia plus or minus anesthetic during the surgery, underlying ?dementia, hospital delirium Monitor closely, delirium precautions  Acute kidney injury Resolved Likely prerenal azotemia  Essential hypertension Stable Continue IV hydralazine as needed  COPD Stable, continue inhalers  Prolonged QTc Continue flecainide  History of CVA CTA head and neck with no large vessel occlusion, unchanged severe stenosis of the right PCA P1 segment and unchanged also on the left 2D echo EF of 65% with grade 1 diastolic heart failure no wall motion abnormality.  History of nonobstructive CAD Cath in May 2010 Cont. aspirin  History of pacemaker  BPH Proscar and Flomax.    DVT prophylaxis: ASA as  per ortho Family Communication: Wife at bedside Status is: Inpatient Remains inpatient appropriate because: Level of care      Code Status:     Code Status Orders  (From admission, onward)           Start     Ordered   05/11/21 2322  Full code  Continuous        05/11/21 2321           Code Status History     This patient has a current code status but no historical code status.      Advance Directive Documentation    [place] Most Recent Value  Type of Advance Directive Living will  Pre-existing out of facility DNR order (yellow form or pink MOST form) --  "MOST" Form in Place? --         IV Access:   Peripheral IV   Procedures and diagnostic studies:   No results found.   Medical Consultants:   Orthopedics   Subjective:    Patient denies any new complaints, just some postop pain.    Objective:    Vitals:   05/15/21 2118 05/16/21 0419 05/16/21 0740 05/16/21 1340  BP: (!) 145/68 (!) 158/79 (!) 144/75 137/76  Pulse: 88 81 90 77  Resp: 13 14 16 17   Temp: 98.1 F (36.7 C) 97.7 F (36.5 C) 98 F (36.7 C) 98 F (36.7 C)  TempSrc: Oral Oral Oral Oral  SpO2: 98% 97% 100% 100%  Weight:      Height:       SpO2: 100 % O2 Flow Rate (L/min): 2 L/min   Intake/Output  Summary (Last 24 hours) at 05/16/2021 1644 Last data filed at 05/16/2021 0649 Gross per 24 hour  Intake --  Output 500 ml  Net -500 ml   Filed Weights   05/11/21 2152  Weight: 90.7 kg    Exam: General: NAD, awake, alert Cardiovascular: S1, S2 present Respiratory: CTAB Abdomen: Soft, nontender, nondistended, bowel sounds present Musculoskeletal: No bilateral pedal edema noted, RLE dressing soiled at the upper thigh region, intact Skin: Normal Psychiatry: Normal mood   Data Reviewed:    Labs: Basic Metabolic Panel: Recent Labs  Lab 05/11/21 0020 05/11/21 2140 05/12/21 0544 05/12/21 1957 05/13/21 0800 05/14/21 0159 05/15/21 0808 05/15/21 1019  05/16/21 0151  NA 134*   < > 133* 134*  --  131* 134*  --  134*  K 5.0   < > 5.1 4.1  --  4.2 3.6  --  3.4*  CL 98   < > 100 98  --  99 103  --  102  CO2 25  --  24  --   --  23 24  --  23  GLUCOSE 127*   < > 142* 127*  --  144* 130*  --  130*  BUN 18   < > 23 23  --  18 17  --  16  CREATININE 1.25*   < > 1.46* 1.30*  --  1.13 0.96  --  0.85  CALCIUM 9.0  --  8.9  --   --  7.9* 8.0*  --  8.1*  MG  --   --   --   --  1.4* 1.6*  --  1.8  --    < > = values in this interval not displayed.   GFR Estimated Creatinine Clearance: 77.3 mL/min (by C-G formula based on SCr of 0.85 mg/dL). Liver Function Tests: Recent Labs  Lab 05/11/21 0020  AST 27  ALT 16  ALKPHOS 59  BILITOT 0.3  PROT 5.7*  ALBUMIN 3.6   No results for input(s): LIPASE, AMYLASE in the last 168 hours. No results for input(s): AMMONIA in the last 168 hours. Coagulation profile Recent Labs  Lab 05/11/21 2132  INR 1.0   COVID-19 Labs  No results for input(s): DDIMER, FERRITIN, LDH, CRP in the last 72 hours.  Lab Results  Component Value Date   [place] NEGATIVE 05/11/2021    CBC: Recent Labs  Lab 05/11/21 0020 05/11/21 2140 05/12/21 0544 05/12/21 1957 05/14/21 0159 05/14/21 1856 05/15/21 0808 05/15/21 1836 05/16/21 0151  WBC 9.1  --  12.1*  --   --   --  8.9 9.7 8.9  NEUTROABS  --   --   --   --   --   --  7.4  --  7.2  HGB 13.2   < > 10.5*   < > 5.9* 7.0* 6.8* 7.7* 8.2*  HCT 39.4   < > 29.8*   < > 17.3* 20.1* 19.4* 22.9* 24.1*  MCV 91.0  --  86.4  --   --   --  89.4 88.8 88.9  PLT 202  --  228  --   --   --  155 166 183   < > = values in this interval not displayed.   Cardiac Enzymes: No results for input(s): CKTOTAL, CKMB, CKMBINDEX, TROPONINI in the last 168 hours. BNP (last 3 results) No results for input(s): PROBNP in the last 8760 hours. CBG: No results for input(s): GLUCAP in the last 168 hours. D-Dimer: No  results for input(s): DDIMER in the last 72 hours. Hgb A1c: No results for  input(s): HGBA1C in the last 72 hours. Lipid Profile: No results for input(s): CHOL, HDL, LDLCALC, TRIG, CHOLHDL, LDLDIRECT in the last 72 hours. Thyroid function studies: No results for input(s): TSH, T4TOTAL, T3FREE, THYROIDAB in the last 72 hours.  Invalid input(s): FREET3 Anemia work up: No results for input(s): VITAMINB12, FOLATE, FERRITIN, TIBC, IRON, RETICCTPCT in the last 72 hours. Sepsis Labs: Recent Labs  Lab 05/11/21 2132 05/12/21 0544 05/15/21 0808 05/15/21 1836 05/16/21 0151  WBC  --  12.1* 8.9 9.7 8.9  LATICACIDVEN 3.7*  --   --   --   --    Microbiology Recent Results (from the past 240 hour(s))  Resp Panel by RT-PCR (Flu A&B, Covid) Nasopharyngeal Swab     Status: None   Collection Time: 05/11/21  9:32 PM   Specimen: Nasopharyngeal Swab; Nasopharyngeal(NP) swabs in vial transport medium  Result Value Ref Range Status   SARS Coronavirus 2 by RT PCR NEGATIVE NEGATIVE Final    Comment: (NOTE) SARS-CoV-2 target nucleic acids are NOT DETECTED.  The SARS-CoV-2 RNA is generally detectable in upper respiratory specimens during the acute phase of infection. The lowest concentration of SARS-CoV-2 viral copies this assay can detect is 138 copies/mL. A negative result does not preclude SARS-Cov-2 infection and should not be used as the sole basis for treatment or other patient management decisions. A negative result may occur with  improper specimen collection/handling, submission of specimen other than nasopharyngeal swab, presence of viral mutation(s) within the areas targeted by this assay, and inadequate number of viral copies(<138 copies/mL). A negative result must be combined with clinical observations, patient history, and epidemiological information. The expected result is Negative.  Fact Sheet for Patients:  EntrepreneurPulse.com.au  Fact Sheet for Healthcare Providers:  IncredibleEmployment.be  This test is no t yet  approved or cleared by the [place] FDA and  has been authorized for detection and/or diagnosis of SARS-CoV-2 by FDA under an Emergency Use Authorization (EUA). This EUA will remain  in effect (meaning this test can be used) for the duration of the COVID-19 declaration under Section 564(b)(1) of the Act, 21 U.S.C.section 360bbb-3(b)(1), unless the authorization is terminated  or revoked sooner.       Influenza A by PCR NEGATIVE NEGATIVE Final   Influenza B by PCR NEGATIVE NEGATIVE Final    Comment: (NOTE) The Xpert Xpress SARS-CoV-2/FLU/RSV plus assay is intended as an aid in the diagnosis of influenza from Nasopharyngeal swab specimens and should not be used as a sole basis for treatment. Nasal washings and aspirates are unacceptable for Xpert Xpress SARS-CoV-2/FLU/RSV testing.  Fact Sheet for Patients: EntrepreneurPulse.com.au  Fact Sheet for Healthcare Providers: IncredibleEmployment.be  This test is not yet approved or cleared by the [place] FDA and has been authorized for detection and/or diagnosis of SARS-CoV-2 by FDA under an Emergency Use Authorization (EUA). This EUA will remain in effect (meaning this test can be used) for the duration of the COVID-19 declaration under Section 564(b)(1) of the Act, 21 U.S.C. section 360bbb-3(b)(1), unless the authorization is terminated or revoked.  Performed at [facility], [place] [address] [place], [place] [address]      Medications:    aspirin  300 mg Rectal Daily   Or   aspirin EC  325 mg Oral Daily   atorvastatin  40 mg Oral QHS   finasteride  5 mg Oral QHS   flecainide  100  mg Oral BID   pantoprazole  40 mg Oral Daily   sertraline  100 mg Oral q morning   Continuous Infusions:      LOS: 5 days   Alma Friendly, MD  [facility]  05/16/2021, 4:44 PM

## 2021-05-16 NOTE — Progress Notes (Signed)
?  Subjective: ?Patient stable.  Having pain in the right leg and to a lesser degree in the left ankle  ? ?Objective: ?Vital signs in last 24 hours: ?Temp:  [97.7 ?F (36.5 ?C)-98.6 ?F (37 ?C)] 98 ?F (36.7 ?C) (03/05 0740) ?Pulse Rate:  [74-91] 90 (03/05 0740) ?Resp:  [12-18] 16 (03/05 0740) ?BP: (121-158)/(50-79) 144/75 (03/05 0740) ?SpO2:  [97 %-100 %] 100 % (03/05 0740) ? ?Intake/Output from previous day: ?03/04 0701 - 03/05 0700 ?In: 365 [I.V.:50; Blood:315] ?Out: 700 [Urine:700] ?Intake/Output this shift: ?No intake/output data recorded. ? ?Exam: ?Right ankle dorsiflexion plantarflexion intact. ?Right foot perfused ?Compartment soft ?Sensation intact in the right leg ?Labs: ?Recent Labs  ?  05/14/21 ?0159 05/14/21 ?1856 05/15/21 ?0808 05/15/21 ?1836 05/16/21 ?0151  ?HGB 5.9* 7.0* 6.8* 7.7* 8.2*  ? ?Recent Labs  ?  05/15/21 ?1836 05/16/21 ?0151  ?WBC 9.7 8.9  ?RBC 2.58* 2.71*  ?HCT 22.9* 24.1*  ?PLT 166 183  ? ?Recent Labs  ?  05/15/21 ?0808 05/16/21 ?0151  ?NA 134* 134*  ?K 3.6 3.4*  ?CL 103 102  ?CO2 24 23  ?BUN 17 16  ?CREATININE 0.96 0.85  ?GLUCOSE 130* 130*  ?CALCIUM 8.0* 8.1*  ? ?No results for input(s): LABPT, INR in the last 72 hours. ? ?Assessment/Plan: ?Right periprosthetic fracture status post plate fixation.  Patient having expected amount of postop pain.  This will be a difficult mobilization process for him.  Hemoglobin 8.2 on aspirin for DVT prophylaxis ? ? ?Anderson Malta ?05/16/2021, 11:51 AM  ? ? ?  ?

## 2021-05-17 ENCOUNTER — Inpatient Hospital Stay (HOSPITAL_COMMUNITY): Payer: No Typology Code available for payment source

## 2021-05-17 DIAGNOSIS — I10 Essential (primary) hypertension: Secondary | ICD-10-CM | POA: Diagnosis not present

## 2021-05-17 DIAGNOSIS — S7291XA Unspecified fracture of right femur, initial encounter for closed fracture: Secondary | ICD-10-CM | POA: Diagnosis not present

## 2021-05-17 DIAGNOSIS — Z950 Presence of cardiac pacemaker: Secondary | ICD-10-CM | POA: Diagnosis not present

## 2021-05-17 DIAGNOSIS — Z8673 Personal history of transient ischemic attack (TIA), and cerebral infarction without residual deficits: Secondary | ICD-10-CM | POA: Diagnosis not present

## 2021-05-17 LAB — BASIC METABOLIC PANEL WITH GFR
Anion gap: 8 (ref 5–15)
BUN: 10 mg/dL (ref 8–23)
CO2: 24 mmol/L (ref 22–32)
Calcium: 8.5 mg/dL — ABNORMAL LOW (ref 8.9–10.3)
Chloride: 101 mmol/L (ref 98–111)
Creatinine, Ser: 0.75 mg/dL (ref 0.61–1.24)
GFR, Estimated: >60 mL/min
Glucose, Bld: 132 mg/dL — ABNORMAL HIGH (ref 70–99)
Potassium: 3.7 mmol/L (ref 3.5–5.1)
Sodium: 133 mmol/L — ABNORMAL LOW (ref 135–145)

## 2021-05-17 LAB — CBC WITH DIFFERENTIAL/PLATELET
Abs Immature Granulocytes: 0.04 10*3/uL (ref 0.00–0.07)
Basophils Absolute: 0 10*3/uL (ref 0.0–0.1)
Basophils Relative: 0 %
Eosinophils Absolute: 0.1 10*3/uL (ref 0.0–0.5)
Eosinophils Relative: 2 %
HCT: 26.2 % — ABNORMAL LOW (ref 39.0–52.0)
Hemoglobin: 9.1 g/dL — ABNORMAL LOW (ref 13.0–17.0)
Immature Granulocytes: 1 %
Lymphocytes Relative: 14 %
Lymphs Abs: 1.2 10*3/uL (ref 0.7–4.0)
MCH: 30.3 pg (ref 26.0–34.0)
MCHC: 34.7 g/dL (ref 30.0–36.0)
MCV: 87.3 fL (ref 80.0–100.0)
Monocytes Absolute: 0.5 10*3/uL (ref 0.1–1.0)
Monocytes Relative: 6 %
Neutro Abs: 6.8 10*3/uL (ref 1.7–7.7)
Neutrophils Relative %: 77 %
Platelets: 249 10*3/uL (ref 150–400)
RBC: 3 MIL/uL — ABNORMAL LOW (ref 4.22–5.81)
RDW: 14.4 % (ref 11.5–15.5)
WBC: 8.7 10*3/uL (ref 4.0–10.5)
nRBC: 0 % (ref 0.0–0.2)

## 2021-05-17 MED ORDER — PROCHLORPERAZINE EDISYLATE 10 MG/2ML IJ SOLN
5.0000 mg | Freq: Four times a day (QID) | INTRAMUSCULAR | Status: DC | PRN
  Administered 2021-05-17 – 2021-05-18 (×2): 5 mg via INTRAVENOUS
  Filled 2021-05-17 (×2): qty 2

## 2021-05-17 MED ORDER — PREGABALIN 100 MG PO CAPS
200.0000 mg | ORAL_CAPSULE | Freq: Two times a day (BID) | ORAL | Status: DC
  Administered 2021-05-17: 200 mg via ORAL
  Filled 2021-05-17 (×2): qty 2

## 2021-05-17 MED ORDER — PROCHLORPERAZINE EDISYLATE 10 MG/2ML IJ SOLN
5.0000 mg | Freq: Once | INTRAMUSCULAR | Status: AC
  Administered 2021-05-17: 5 mg via INTRAVENOUS
  Filled 2021-05-17: qty 2

## 2021-05-17 NOTE — Progress Notes (Signed)
? ?  Subjective: ? ?Endorses moderate pain in knee today. Hg responding appropriately to transfusion. Tolerating diet. ? ?Objective:  ? ?VITALS:   ?Vitals:  ? 05/16/21 0740 05/16/21 1340 05/16/21 1953 05/17/21 0300  ?BP: (!) 144/75 137/76 (!) 182/94 (!) 144/81  ?Pulse: 90 77 87 87  ?Resp: 16 17 16 16   ?Temp: 98 ?F (36.7 ?C) 98 ?F (36.7 ?C)  98.1 ?F (36.7 ?C)  ?TempSrc: Oral Oral  Oral  ?SpO2: 100% 100% 100% 99%  ?Weight:      ?Height:      ? ? ?Dressing over right lower extremity is clean dry intact.  Is able to fire extensors of all of his toes as well as the flexors of the right foot.  Sensation is intact in all distributions of the right foot.  2+ dorsalis pedis pulse ? ? ?Lab Results  ?Component Value Date  ? WBC 8.7 05/17/2021  ? HGB 9.1 (L) 05/17/2021  ? HCT 26.2 (L) 05/17/2021  ? MCV 87.3 05/17/2021  ? PLT 249 05/17/2021  ? ? ? ?Assessment/Plan: ? ?5 Days Post-Op status post right femur open reduction internal fixation, doing well ? ?- Expected postop acute blood loss anemia - monitor vitals ?- Patient to work with PT/OT to optimize mobilization safely ?- DVT ppx - SCDs, ambulation, Aspirin ?- NWB operative extremity ?- Pain control - multimodal pain management, ATC acetaminophen in conjunction with as needed narcotic (oxycodone), although this should be minimized with other modalities  ?- Discharge planning pending CM, appreciate coordination  ? ? ?07/17/2021 ?05/17/2021, 7:45 AM ? ?

## 2021-05-17 NOTE — Plan of Care (Signed)
  Problem: Education: Goal: Knowledge of General Education information will improve Description: Including pain rating scale, medication(s)/side effects and non-pharmacologic comfort measures Outcome: Progressing   Problem: Elimination: Goal: Will not experience complications related to urinary retention Outcome: Progressing   

## 2021-05-17 NOTE — Progress Notes (Signed)
Physical Therapy Treatment ?Patient Details ?Name: Marcus Mcgee ?MRN: [id number] ?DOB: 10/21/1934 ?Today's Date: 05/17/2021 ? ? ?History of Present Illness 87 yo admitted 2/28 after fall at home with Rt femur fx s/p ORIF 3/1. PMhx: bil TKA, bil THA, HTN, cOPD, CVA ? ?  ?PT Comments  ? ? Pt premedicated and continues to be limited by pain and decreased strength. Pt motivated and able to initiate mobilization of RLE with strap toward EOB in small increments, but ultimately requiring mod-max assist to come to sitting EOB. Once sitting pt unable to initiate standing secondary to pain level and inability to maintain NWB status. Pt educated re; positioning, LE exercise to increase strength and ROM and continued mobility with pt demonstrating and verbalizing understanding. Supportive family is at bedside and continues to be agreeable to pt going to rehab in SNF with pt in agreement as well. Pt continues to benefit from skilled PT services to progress toward functional mobility goals.  ? ?  ?Recommendations for follow up therapy are one component of a multi-disciplinary discharge planning process, led by the attending physician.  Recommendations may be updated based on patient status, additional functional criteria and insurance authorization. ? ?Follow Up Recommendations ? Skilled nursing-short term rehab (<3 hours/day) ?  ?  ?Assistance Recommended at Discharge Frequent or constant Supervision/Assistance  ?Patient can return home with the following Two people to help with walking and/or transfers;Two people to help with bathing/dressing/bathroom;Assist for transportation;Help with stairs or ramp for entrance;Direct supervision/assist for medications management;Assistance with cooking/housework ?  ?Equipment Recommendations ? None recommended by PT  ?  ?Recommendations for Other Services   ? ? ?  ?Precautions / Restrictions Precautions ?Precautions: Fall ?Restrictions ?Weight Bearing Restrictions: Yes ?RLE Weight Bearing: Non  weight bearing  ?  ? ?Mobility ? Bed Mobility ?Overal bed mobility: Needs Assistance ?Bed Mobility: Supine to Sit, Sit to Supine ?  ?  ?Supine to sit: HOB elevated, Max assist, Mod assist ?Sit to supine: Mod assist, Max assist ?  ?General bed mobility comments: pt able to initiated to lift trunk and to mobilize BLE to EOB with strap, assist for LEs over EOB and to raise trunk, guided trunk and assisted LEs back into bed, ?  ? ?Transfers ?Overall transfer level: Needs assistance ?  ?  ?  ?  ?  ?  ?  ?  ?General transfer comment: once EOB pt stating he cannot stand and declines to attempt ?  ? ?Ambulation/Gait ?  ?  ?  ?  ?  ?  ?  ?  ? ? ?Stairs ?  ?  ?  ?  ?  ? ? ?Wheelchair Mobility ?  ? ?Modified Rankin (Stroke Patients Only) ?  ? ? ?  ?Balance Overall balance assessment: Needs assistance ?Sitting-balance support: No upper extremity supported ?Sitting balance-Leahy Scale: Fair ?Sitting balance - Comments: EOB without UE support ?  ?  ?  ?  ?  ?  ?  ?  ?  ?  ?  ?  ?  ?  ?  ?  ? ?  ?Cognition Arousal/Alertness: Awake/alert ?Behavior During Therapy: Flat affect ?Overall Cognitive Status: Impaired/Different from baseline ?  ?  ?  ?  ?  ?  ?  ?  ?  ?  ?Current Attention Level: Focused ?Memory: Decreased recall of precautions, Decreased short-term memory ?Following Commands: Follows one step commands inconsistently ?Safety/Judgement: Decreased awareness of safety, Decreased awareness of deficits ?  ?Problem Solving: Slow processing, Decreased initiation, Difficulty  sequencing, Requires verbal cues, Requires tactile cues ?  ?  ?  ? ?  ?Exercises General Exercises - Lower Extremity ?Ankle Circles/Pumps: AROM, Both, 20 reps, Supine ?Quad Sets: AROM, Right, 10 reps, Supine ?Gluteal Sets: AROM, Right, 10 reps, Supine ? ?  ?General Comments   ?  ?  ? ?Pertinent Vitals/Pain Pain Assessment ?Pain Assessment: Faces ?Faces Pain Scale: Hurts whole lot ?Pain Location: R LE ?Pain Descriptors / Indicators: Grimacing, Guarding, Sore,  Moaning ?Pain Intervention(s): Limited activity within patient's tolerance, Monitored during session, Repositioned  ? ? ?Home Living   ?  ?  ?  ?  ?  ?  ?  ?  ?  ?   ?  ?Prior Function    ?  ?  ?   ? ?PT Goals (current goals can now be found in the care plan section) Acute Rehab PT Goals ?Patient Stated Goal: be able to walk and not fall ?PT Goal Formulation: With patient/family ?Time For Goal Achievement: 05/27/21 ? ?  ?Frequency ? ? ? Min 3X/week ? ? ? ?  ?PT Plan    ? ? ?Co-evaluation   ?  ?  ?  ?  ? ?  ?AM-PAC PT "6 Clicks" Mobility   ?Outcome Measure ? Help needed turning from your back to your side while in a flat bed without using bedrails?: A Lot ?Help needed moving from lying on your back to sitting on the side of a flat bed without using bedrails?: A Lot ?Help needed moving to and from a bed to a chair (including a wheelchair)?: Total ?Help needed standing up from a chair using your arms (e.g., wheelchair or bedside chair)?: Total ?Help needed to walk in hospital room?: Total ?Help needed climbing 3-5 steps with a railing? : Total ?6 Click Score: 8 ? ?  ?End of Session   ?Activity Tolerance: Patient limited by pain ?Patient left: in bed;with call bell/phone within reach;with bed alarm set;with family/visitor present ?Nurse Communication: Mobility status ?PT Visit Diagnosis: Other abnormalities of gait and mobility (R26.89);Repeated falls (R29.6);Muscle weakness (generalized) (M62.81);Unsteadiness on feet (R26.81);Pain ?Pain - Right/Left: Right ?Pain - part of body: Leg ?  ? ? ?Time: [id number] ?PT Time Calculation (min) (ACUTE ONLY): 30 min ? ?Charges:  $Therapeutic Activity: 23-37 mins          ?          ? ?Audry Riles. PTA ?Acute Rehabilitation Services ?Office: [phone] ? ? ? ?Betsey Holiday Johnothan Bascomb ?05/17/2021, 10:16 AM ? ?

## 2021-05-17 NOTE — Progress Notes (Signed)
[facility] PROGRESS NOTE    AVEON COLQUHOUN  KGU:[id number] DOB: 10/22/1934 DOA: 05/11/2021 PCP: Selina Cooley, MD     HPI Marcus Mcgee is an 87 y.o. male past medical history significant for HTN, pacemaker with history arrhythmia, history of CVA who presents by EMS after a fall after this he noticed deformity of his right thigh EMS was called according to his wife he has been having difficulty with balance and has had couple of falls with no injuries.  He saw neurologist the day prior to admission as he was having memory difficulties.  Family relates he has been falling for the last several months. Orthopedic surgery was consulted, had ORIF on 05/12/21.    Assessment/plan  Closed fracture of right femur, initial encounter [facility]): Orthopedic surgery consulted, status post ORIF of the distal femur on 05/12/2021 Pain management, DVT PPx per orthopedics (holding narcotics) Continue ASA 325 mg as DVT ppx PT/OT- SNF  Acute blood loss anemia Postop surgical hemoglobin 5.9, transfused 2 units of PRBC--> 7-->6.8 transfused 1U of PRBC on 05/15/21-->8.2-->9.1 As per orthopedics, expected blood loss Daily CBC  Acute metabolic encephalopathy Improving Likely due to Lyrica, trazodone, narcotics, severe anemia plus or minus anesthetic during the surgery, underlying ?dementia, hospital delirium Monitor closely, delirium precautions  Persistent nausea/poor oral intake DG abdomen x-ray unremarkable As needed Compazine for nausea Monitor closely  Acute kidney injury Resolved Likely prerenal azotemia  Essential hypertension Stable Continue IV hydralazine as needed  COPD Stable, continue inhalers  Prolonged QTc Continue flecainide  History of CVA CTA head and neck with no large vessel occlusion, unchanged severe stenosis of the right PCA P1 segment and unchanged also on the left 2D echo EF of 65% with grade 1 diastolic heart failure no wall motion abnormality.  History of  nonobstructive CAD Cath in May 2010 Cont. aspirin  History of pacemaker  BPH Proscar and Flomax.    DVT prophylaxis: ASA as per ortho Family Communication: Wife at bedside Status is: Inpatient Remains inpatient appropriate because: Level of care      Code Status:     Code Status Orders  (From admission, onward)           Start     Ordered   05/11/21 2322  Full code  Continuous        05/11/21 2321           Code Status History     This patient has a current code status but no historical code status.      Advance Directive Documentation    Flowsheet Row Most Recent Value  Type of Advance Directive Living will  Pre-existing out of facility DNR order (yellow form or pink MOST form) --  "MOST" Form in Place? --         IV Access:   Peripheral IV   Procedures and diagnostic studies:   DG Abd Portable 1V  Result Date: 05/17/2021 CLINICAL DATA:  Nausea and vomiting EXAM: PORTABLE ABDOMEN - 1 VIEW COMPARISON:  None. FINDINGS: Bowel gas pattern is nonspecific. Gas is present in colon. There is small amount of stool in colon without signs of fecal impaction. No abnormal masses or calcifications are seen. Kidneys are mostly obscured by bowel contents. There is arthroplasty in both hips. Degenerative changes are noted in lumbar spine. IMPRESSION: Nonspecific bowel gas pattern. Electronically Signed   By: Ernie Avena M.D.   On: 05/17/2021 16:04     Medical Consultants:   Orthopedics  Subjective:    Patient reports feeling nauseous, still with significant postop pain, will gradually restart his home meds including Lyrica and low-dose narcotics and monitor mentation closely.   Objective:    Vitals:   05/17/21 0730 05/17/21 1220 05/17/21 1327 05/17/21 1520  BP: (!) 161/94 (!) 151/76 (!) 162/86 (!) 161/84  Pulse: 92 88 82 89  Resp: 16 14 14 17   Temp: 98.3 F (36.8 C)   98.2 F (36.8 C)  TempSrc: Oral   Oral  SpO2: 99% 98% 96% 100%   Weight:      Height:       SpO2: 100 % O2 Flow Rate (L/min): 2 L/min  No intake or output data in the 24 hours ending 05/17/21 1640  Filed Weights   05/11/21 2152  Weight: 90.7 kg    Exam: General: NAD, awake, alert Cardiovascular: S1, S2 present Respiratory: CTAB Abdomen: Soft, nontender, nondistended, bowel sounds present Musculoskeletal: No bilateral pedal edema noted, RLE dressing soiled at the upper thigh region, intact Skin: Normal Psychiatry: Normal mood   Data Reviewed:    Labs: Basic Metabolic Panel: Recent Labs  Lab 05/12/21 0544 05/12/21 1957 05/13/21 0800 05/14/21 0159 05/15/21 0808 05/15/21 1019 05/16/21 0151 05/17/21 0620  NA 133* 134*  --  131* 134*  --  134* 133*  K 5.1 4.1  --  4.2 3.6  --  3.4* 3.7  CL 100 98  --  99 103  --  102 101  CO2 24  --   --  23 24  --  23 24  GLUCOSE 142* 127*  --  144* 130*  --  130* 132*  BUN 23 23  --  18 17  --  16 10  CREATININE 1.46* 1.30*  --  1.13 0.96  --  0.85 0.75  CALCIUM 8.9  --   --  7.9* 8.0*  --  8.1* 8.5*  MG  --   --  1.4* 1.6*  --  1.8  --   --    GFR Estimated Creatinine Clearance: 82.2 mL/min (by C-G formula based on SCr of 0.75 mg/dL). Liver Function Tests: Recent Labs  Lab 05/11/21 0020  AST 27  ALT 16  ALKPHOS 59  BILITOT 0.3  PROT 5.7*  ALBUMIN 3.6   No results for input(s): LIPASE, AMYLASE in the last 168 hours. No results for input(s): AMMONIA in the last 168 hours. Coagulation profile Recent Labs  Lab 05/11/21 2132  INR 1.0   COVID-19 Labs  No results for input(s): DDIMER, FERRITIN, LDH, CRP in the last 72 hours.  Lab Results  Component Value Date   SARSCOV2NAA NEGATIVE 05/11/2021    CBC: Recent Labs  Lab 05/12/21 0544 05/12/21 1957 05/14/21 1856 05/15/21 0808 05/15/21 1836 05/16/21 0151 05/17/21 0620  WBC 12.1*  --   --  8.9 9.7 8.9 8.7  NEUTROABS  --   --   --  7.4  --  7.2 6.8  HGB 10.5*   < > 7.0* 6.8* 7.7* 8.2* 9.1*  HCT 29.8*   < > 20.1* 19.4*  22.9* 24.1* 26.2*  MCV 86.4  --   --  89.4 88.8 88.9 87.3  PLT 228  --   --  155 166 183 249   < > = values in this interval not displayed.   Cardiac Enzymes: No results for input(s): CKTOTAL, CKMB, CKMBINDEX, TROPONINI in the last 168 hours. BNP (last 3 results) No results for input(s): PROBNP in the last 8760 hours.  CBG: No results for input(s): GLUCAP in the last 168 hours. D-Dimer: No results for input(s): DDIMER in the last 72 hours. Hgb A1c: No results for input(s): HGBA1C in the last 72 hours. Lipid Profile: No results for input(s): CHOL, HDL, LDLCALC, TRIG, CHOLHDL, LDLDIRECT in the last 72 hours. Thyroid function studies: No results for input(s): TSH, T4TOTAL, T3FREE, THYROIDAB in the last 72 hours.  Invalid input(s): FREET3 Anemia work up: No results for input(s): VITAMINB12, FOLATE, FERRITIN, TIBC, IRON, RETICCTPCT in the last 72 hours. Sepsis Labs: Recent Labs  Lab 05/11/21 2132 05/12/21 0544 05/15/21 0808 05/15/21 1836 05/16/21 0151 05/17/21 0620  WBC  --    < > 8.9 9.7 8.9 8.7  LATICACIDVEN 3.7*  --   --   --   --   --    < > = values in this interval not displayed.   Microbiology Recent Results (from the past 240 hour(s))  Resp Panel by RT-PCR (Flu A&B, Covid) Nasopharyngeal Swab     Status: None   Collection Time: 05/11/21  9:32 PM   Specimen: Nasopharyngeal Swab; Nasopharyngeal(NP) swabs in vial transport medium  Result Value Ref Range Status   SARS Coronavirus 2 by RT PCR NEGATIVE NEGATIVE Final    Comment: (NOTE) SARS-CoV-2 target nucleic acids are NOT DETECTED.  The SARS-CoV-2 RNA is generally detectable in upper respiratory specimens during the acute phase of infection. The lowest concentration of SARS-CoV-2 viral copies this assay can detect is 138 copies/mL. A negative result does not preclude SARS-Cov-2 infection and should not be used as the sole basis for treatment or other patient management decisions. A negative result may occur with   improper specimen collection/handling, submission of specimen other than nasopharyngeal swab, presence of viral mutation(s) within the areas targeted by this assay, and inadequate number of viral copies(<138 copies/mL). A negative result must be combined with clinical observations, patient history, and epidemiological information. The expected result is Negative.  Fact Sheet for Patients:  BloggerCourse.com  Fact Sheet for Healthcare Providers:  SeriousBroker.it  This test is no t yet approved or cleared by the [place] FDA and  has been authorized for detection and/or diagnosis of SARS-CoV-2 by FDA under an Emergency Use Authorization (EUA). This EUA will remain  in effect (meaning this test can be used) for the duration of the COVID-19 declaration under Section 564(b)(1) of the Act, 21 U.S.C.section 360bbb-3(b)(1), unless the authorization is terminated  or revoked sooner.       Influenza A by PCR NEGATIVE NEGATIVE Final   Influenza B by PCR NEGATIVE NEGATIVE Final    Comment: (NOTE) The Xpert Xpress SARS-CoV-2/FLU/RSV plus assay is intended as an aid in the diagnosis of influenza from Nasopharyngeal swab specimens and should not be used as a sole basis for treatment. Nasal washings and aspirates are unacceptable for Xpert Xpress SARS-CoV-2/FLU/RSV testing.  Fact Sheet for Patients: BloggerCourse.com  Fact Sheet for Healthcare Providers: SeriousBroker.it  This test is not yet approved or cleared by the [place] FDA and has been authorized for detection and/or diagnosis of SARS-CoV-2 by FDA under an Emergency Use Authorization (EUA). This EUA will remain in effect (meaning this test can be used) for the duration of the COVID-19 declaration under Section 564(b)(1) of the Act, 21 U.S.C. section 360bbb-3(b)(1), unless the authorization is terminated  or revoked.  Performed at [facility], [address], [place], [place] [address]      Medications:    aspirin  300 mg Rectal Daily  Or   aspirin EC  325 mg Oral Daily   atorvastatin  40 mg Oral QHS   finasteride  5 mg Oral QHS   flecainide  100 mg Oral BID   pantoprazole  40 mg Oral Daily   sertraline  100 mg Oral q morning   Continuous Infusions:      LOS: 6 days   Briant CedarNkeiruka J Santana Edell, MD  [facility]  05/17/2021, 4:40 PM

## 2021-05-17 NOTE — TOC Progression Note (Addendum)
Transition of Care (TOC) - Progression Note  ? ? ?Patient Details  ?Name: Marcus Mcgee ?MRN: [id number] ?Date of Birth: Jun 25, 1934 ? ?Transition of Care (TOC) CM/SW Contact  ?Lorri Frederick, LCSW ?Phone Number: ?05/17/2021, 10:52 AM ? ?Clinical Narrative:    ?CSW notes [facility] has rescinded bed offer, informed pt and wife, they requested response from [facility], who does offer bed.  Insurance auth request initiated in [place].   ? ?1400: Auth approved in [place], CSW then informed family is requesting CSW come back, spoke with wife and with son who is now present and they would like CSW to get response from [facility] about possible placement there.  CSW spoke with [name], who will review referral.  ? ?Expected Discharge Plan: Skilled Nursing Facility ?Barriers to Discharge: Continued Medical Work up ? ?Expected Discharge Plan and Services ?Expected Discharge Plan: Skilled Nursing Facility ?  ?Discharge Planning Services: CM Consult ?Post Acute Care Choice: Durable Medical Equipment (hospital bed) ?Living arrangements for the past 2 months: Single Family Home ?                ?  ?  ?  ?  ?  ?  ?HH Agency: [facility] ?Date HH Agency Contacted: 05/13/21 ?Time HH Agency Contacted: 1459 ?Representative spoke with at [facility] Agency: Barbara Cower ? ? ?Social Determinants of Health (SDOH) Interventions ?  ? ?Readmission Risk Interventions ?No flowsheet data found. ? ?

## 2021-05-18 ENCOUNTER — Inpatient Hospital Stay (HOSPITAL_COMMUNITY): Payer: No Typology Code available for payment source

## 2021-05-18 DIAGNOSIS — I10 Essential (primary) hypertension: Secondary | ICD-10-CM | POA: Diagnosis not present

## 2021-05-18 DIAGNOSIS — Z8673 Personal history of transient ischemic attack (TIA), and cerebral infarction without residual deficits: Secondary | ICD-10-CM | POA: Diagnosis not present

## 2021-05-18 DIAGNOSIS — J410 Simple chronic bronchitis: Secondary | ICD-10-CM | POA: Diagnosis not present

## 2021-05-18 DIAGNOSIS — S7291XA Unspecified fracture of right femur, initial encounter for closed fracture: Secondary | ICD-10-CM | POA: Diagnosis not present

## 2021-05-18 LAB — URINALYSIS, ROUTINE W REFLEX MICROSCOPIC
Bilirubin Urine: NEGATIVE
Glucose, UA: NEGATIVE mg/dL
Hgb urine dipstick: NEGATIVE
Ketones, ur: NEGATIVE mg/dL
Leukocytes,Ua: NEGATIVE
Nitrite: NEGATIVE
Protein, ur: 30 mg/dL — AB
Specific Gravity, Urine: 1.01 (ref 1.005–1.030)
pH: 7 (ref 5.0–8.0)

## 2021-05-18 LAB — URINALYSIS, MICROSCOPIC (REFLEX): Bacteria, UA: NONE SEEN

## 2021-05-18 LAB — CBC WITH DIFFERENTIAL/PLATELET
Abs Immature Granulocytes: 0.11 10*3/uL — ABNORMAL HIGH (ref 0.00–0.07)
Basophils Absolute: 0 10*3/uL (ref 0.0–0.1)
Basophils Relative: 0 %
Eosinophils Absolute: 0 10*3/uL (ref 0.0–0.5)
Eosinophils Relative: 0 %
HCT: 27.4 % — ABNORMAL LOW (ref 39.0–52.0)
Hemoglobin: 9.6 g/dL — ABNORMAL LOW (ref 13.0–17.0)
Immature Granulocytes: 1 %
Lymphocytes Relative: 10 %
Lymphs Abs: 1.2 10*3/uL (ref 0.7–4.0)
MCH: 30.1 pg (ref 26.0–34.0)
MCHC: 35 g/dL (ref 30.0–36.0)
MCV: 85.9 fL (ref 80.0–100.0)
Monocytes Absolute: 0.8 10*3/uL (ref 0.1–1.0)
Monocytes Relative: 6 %
Neutro Abs: 10.2 10*3/uL — ABNORMAL HIGH (ref 1.7–7.7)
Neutrophils Relative %: 83 %
Platelets: 288 10*3/uL (ref 150–400)
RBC: 3.19 MIL/uL — ABNORMAL LOW (ref 4.22–5.81)
RDW: 14 % (ref 11.5–15.5)
WBC: 12.4 10*3/uL — ABNORMAL HIGH (ref 4.0–10.5)
nRBC: 0 % (ref 0.0–0.2)

## 2021-05-18 LAB — BASIC METABOLIC PANEL
Anion gap: 9 (ref 5–15)
BUN: 13 mg/dL (ref 8–23)
CO2: 21 mmol/L — ABNORMAL LOW (ref 22–32)
Calcium: 8.6 mg/dL — ABNORMAL LOW (ref 8.9–10.3)
Chloride: 97 mmol/L — ABNORMAL LOW (ref 98–111)
Creatinine, Ser: 0.74 mg/dL (ref 0.61–1.24)
GFR, Estimated: >60 mL/min (ref >60)
Glucose, Bld: 136 mg/dL — ABNORMAL HIGH (ref 70–99)
Potassium: 3.3 mmol/L — ABNORMAL LOW (ref 3.5–5.1)
Sodium: 127 mmol/L — ABNORMAL LOW (ref 135–145)

## 2021-05-18 LAB — MAGNESIUM: Magnesium: 1.8 mg/dL (ref 1.7–2.4)

## 2021-05-18 MED ORDER — IOHEXOL 300 MG/ML  SOLN
100.0000 mL | Freq: Once | INTRAMUSCULAR | Status: AC | PRN
  Administered 2021-05-18: 100 mL via INTRAVENOUS

## 2021-05-18 MED ORDER — SODIUM CHLORIDE 0.9 % IV SOLN
INTRAVENOUS | Status: AC
Start: 2021-05-18 — End: 2021-05-18

## 2021-05-18 MED ORDER — PROCHLORPERAZINE EDISYLATE 10 MG/2ML IJ SOLN: 10.0000 mg | Freq: Four times a day (QID) | INTRAMUSCULAR | Status: AC | PRN

## 2021-05-18 MED ORDER — ENSURE ENLIVE PO LIQD
237.0000 mL | Freq: Two times a day (BID) | ORAL | Status: DC
  Administered 2021-05-19 – 2021-05-24 (×7): 237 mL via ORAL

## 2021-05-18 MED ORDER — PREGABALIN 100 MG PO CAPS
100.0000 mg | ORAL_CAPSULE | Freq: Two times a day (BID) | ORAL | Status: DC
  Administered 2021-05-18 – 2021-05-24 (×13): 100 mg via ORAL
  Filled 2021-05-18 (×13): qty 1

## 2021-05-18 MED ORDER — PROCHLORPERAZINE EDISYLATE 10 MG/2ML IJ SOLN
10.0000 mg | Freq: Four times a day (QID) | INTRAMUSCULAR | Status: AC | PRN
  Filled 2021-05-18: qty 2

## 2021-05-18 MED ORDER — POTASSIUM CHLORIDE CRYS ER 20 MEQ PO TBCR
40.0000 meq | EXTENDED_RELEASE_TABLET | Freq: Once | ORAL | Status: AC
  Administered 2021-05-18: 40 meq via ORAL
  Filled 2021-05-18: qty 2

## 2021-05-18 NOTE — Plan of Care (Signed)

## 2021-05-18 NOTE — Progress Notes (Signed)
Placed patient on CPAP for the night via auto-mode.  

## 2021-05-18 NOTE — Progress Notes (Signed)
Occupational Therapy Treatment ?Patient Details ?Name: Marcus Mcgee ?MRN: [id number] ?DOB: [birth year] ?Today's Date: 05/18/2021 ? ? ?History of present illness 87 yo admitted 2/28 after fall at home with Rt femur fx s/p ORIF 3/1. PMhx: bil TKA, bil THA, HTN, cOPD, CVA ?  ?OT comments ? Pt presented with wife in room. Pt's wife expressed concerns about going to SNF at this time and going to speak to patient advocacy. Pt completed supine to sit with mod assist x 2 trials but on first trial reported they were feeling dizzy and going to vomit. Pt then on second attempt when sitting at EOB reported they were going to "black out".. Pt reported once back in supine felt better. Pt currently with functional limitations due to the deficits listed below (see OT Problem List).  Pt will benefit from skilled OT to increase their safety and independence with ADL and functional mobility for ADL to facilitate discharge to venue listed below.  ? ?Pt BP in supine 151/87 ?BP in sitting 143/86 ?  ? ?Recommendations for follow up therapy are one component of a multi-disciplinary discharge planning process, led by the attending physician.  Recommendations may be updated based on patient status, additional functional criteria and insurance authorization. ?   ?Follow Up Recommendations ? Skilled nursing-short term rehab (<3 hours/day)  ?  ?Assistance Recommended at Discharge Frequent or constant Supervision/Assistance  ?Patient can return home with the following ? Two people to help with walking and/or transfers;A lot of help with bathing/dressing/bathroom;Assistance with feeding;Assistance with cooking/housework;Direct supervision/assist for medications management;Direct supervision/assist for financial management;Assist for transportation;Help with stairs or ramp for entrance ?  ?Equipment Recommendations ? Other (comment)  ?  ?Recommendations for Other Services   ? ?  ?Precautions / Restrictions Precautions ?Precautions:  Fall ?Restrictions ?Weight Bearing Restrictions: Yes ?RLE Weight Bearing: Non weight bearing ?Other Position/Activity Restrictions: Pt now has a short boot on LLE but no reported WB changes for sprain ankle  ? ? ?  ? ?Mobility Bed Mobility ?Overal bed mobility: Needs Assistance ?Bed Mobility: Supine to Sit, Sit to Supine ?Rolling: Mod assist ?  ?Supine to sit: Mod assist ?Sit to supine: Min assist ?  ?General bed mobility comments: Pt max cues on how to complete ?  ? ?Transfers ?  ?  ?  ?  ?  ?  ?  ?  ?  ?General transfer comment: Pt unable to transfer as noted they reported they were going to vomit and increase in dizzy on first attempt then on the second attempt reported they were blacking out ?  ?  ?Balance Overall balance assessment: Needs assistance ?Sitting-balance support: Bilateral upper extremity supported ?Sitting balance-Leahy Scale: Poor ?Sitting balance - Comments: Pt limited due to dizziness ?  ?  ?  ?  ?  ?  ?  ?  ?  ?  ?  ?  ?  ?  ?  ?   ? ?ADL either performed or assessed with clinical judgement  ? ?ADL Overall ADL's : Needs assistance/impaired ?Eating/Feeding: Set up;Bed level ?  ?Grooming: Wash/dry hands;Wash/dry face;Set up;Bed level ?  ?Upper Body Bathing: Moderate assistance;Cueing for safety;Cueing for sequencing;Bed level ?  ?Lower Body Bathing: Total assistance;Bed level ?  ?Upper Body Dressing : Bed level;Maximal assistance ?  ?Lower Body Dressing: Maximal assistance;+2 for physical assistance;+2 for safety/equipment ?  ?  ?  ?  ?  ?  ?  ?  ?  ?  ? ?Extremity/Trunk Assessment Upper Extremity Assessment ?Upper Extremity Assessment:  Overall [facility] for tasks assessed ?  ?Lower Extremity Assessment ?Lower Extremity Assessment: Defer to PT evaluation ?  ?  ?  ? ?Vision   ?  ?  ?Perception   ?  ?Praxis   ?  ? ?Cognition Arousal/Alertness: Awake/alert ?Behavior During Therapy: Flat affect ?Overall Cognitive Status: Impaired/Different from baseline ?Area of Impairment: Following commands,  Safety/judgement, Awareness, Problem solving, Attention, Orientation ?  ?  ?  ?  ?  ?  ?  ?  ?Orientation Level: Disoriented to, Situation, Time ?Current Attention Level: Focused ?Memory: Decreased recall of precautions, Decreased short-term memory ?Following Commands: Follows one step commands inconsistently ?Safety/Judgement: Decreased awareness of safety, Decreased awareness of deficits ?Awareness: Intellectual ?Problem Solving: Slow processing, Decreased initiation, Difficulty sequencing, Requires verbal cues, Requires tactile cues ?  ?  ?  ?   ?Exercises   ? ?  ?Shoulder Instructions   ? ? ?  ?General Comments    ? ? ?Pertinent Vitals/ Pain       Pain Assessment ?Pain Assessment: Faces ?Faces Pain Scale: Hurts even more ?Breathing: normal ?Negative Vocalization: none ?Facial Expression: smiling or inexpressive ?Body Language: relaxed ?Consolability: no need to console ?PAINAD Score: 0 ?Pain Location: R LE ?Pain Descriptors / Indicators: Grimacing, Guarding, Sore, Moaning ?Pain Intervention(s): Limited activity within patient's tolerance, Monitored during session, Repositioned ? ?Home Living   ?  ?  ?  ?  ?  ?  ?  ?  ?  ?  ?  ?  ?  ?  ?  ?  ?  ?  ? ?  ?Prior Functioning/Environment    ?  ?  ?  ?   ? ?Frequency ? Min 2X/week  ? ? ? ? ?  ?Progress Toward Goals ? ?OT Goals(current goals can now be found in the care plan section) ? Progress towards OT goals: Progressing toward goals ? ?Acute Rehab OT Goals ?OT Goal Formulation: Patient unable to participate in goal setting ?Time For Goal Achievement: 05/28/21 ?Potential to Achieve Goals: Good ?ADL Goals ?Pt Will Perform Eating: with min assist;sitting;bed level ?Pt Will Perform Grooming: with min assist;sitting ?Pt Will Perform Upper Body Bathing: sitting;with min assist ?Pt Will Perform Upper Body Dressing: with supervision;sitting ?Pt Will Transfer to Toilet: with +2 assist;with min assist;stand pivot transfer;bedside commode ?Additional ADL Goal #1: Pt will  perform bed mobility with moderate assistance in preparation for ADLs. ?Additional ADL Goal #2: Pt will follow one step commands with 50 % accuracy.  ?Plan Discharge plan remains appropriate   ? ?Co-evaluation ? ? ?   ?  ?  ?  ?  ? ?  ?AM-PAC OT "6 Clicks" Daily Activity     ?Outcome Measure ? ? Help from another person eating meals?: A Little ?Help from another person taking care of personal grooming?: A Little ?Help from another person toileting, which includes using toliet, bedpan, or urinal?: Total ?Help from another person bathing (including washing, rinsing, drying)?: Total ?Help from another person to put on and taking off regular upper body clothing?: Total ?Help from another person to put on and taking off regular lower body clothing?: Total ?6 Click Score: 10 ? ?  ?End of Session   ? ?OT Visit Diagnosis: Pain;Muscle weakness (generalized) (M62.81);Other symptoms and signs involving cognitive function ?  ?Activity Tolerance Other (comment) (blacking out feeling) ?  ?Patient Left in bed;with call bell/phone within reach;with bed alarm set;with family/visitor present ?  ?Nurse Communication  (wife's concerns) ?  ? ?   ? ?  Time: 6761-9509 ?OT Time Calculation (min): 47 min ? ?Charges: OT General Charges ?$OT Visit: 1 Visit ?OT Treatments ?$Self Care/Home Management : 38-52 mins ? ?Alphia Moh OTR/L  ?Acute Rehab Services  ?[phone] office number ?[phone] pager number ? ? ?Alphia Moh ?05/18/2021, 11:34 AM ?

## 2021-05-18 NOTE — Progress Notes (Signed)
? ?  Subjective: ? ?Dressing changed. Endorses improved strength in right leg. Short boot provided for left ankle sprain. ? ?Objective:  ? ?VITALS:   ?Vitals:  ? 05/17/21 1327 05/17/21 1520 05/17/21 2020 05/18/21 0732  ?BP: (!) 162/86 (!) 161/84 (!) 152/77 (!) 144/87  ?Pulse: 82 89 76   ?Resp: 14 17 13    ?Temp:  98.2 ?F (36.8 ?C) 98.3 ?F (36.8 ?C) 98 ?F (36.7 ?C)  ?TempSrc:  Oral Oral   ?SpO2: 96% 100% 99%   ?Weight:      ?Height:      ? ? ?Dressing over right lower extremity is clean dry intact.  Is able to fire extensors of all of his toes as well as the flexors of the right foot.  Sensation is intact in all distributions of the right foot.  2+ dorsalis pedis pulse ? ?Left ankle with bruising tenderness over left ATFL. Minimal laxity with anterior drawer, calcaneal tilt but pain. No tenderness about malleoli. 2+ DP pulse. Neurosensory exam intact ? ? ?Lab Results  ?Component Value Date  ? WBC 12.4 (H) 05/18/2021  ? HGB 9.6 (L) 05/18/2021  ? HCT 27.4 (L) 05/18/2021  ? MCV 85.9 05/18/2021  ? PLT 288 05/18/2021  ? ? ? ?Assessment/Plan: ? ?6 Days Post-Op status post right femur open reduction internal fixation, left ankle sprain after fall ? ?- Expected postop acute blood loss anemia - monitor vitals ?- Patient to work with PT/OT to optimize mobilization safely ?- DVT ppx - SCDs, ambulation, Aspirin ?- NWB operative extremity, WBAT left leg with CAM boot for support ?- Pain control - multimodal pain management, ATC acetaminophen in conjunction with as needed narcotic (oxycodone), although this should be minimized with other modalities  ?- Discharge planning pending CM, appreciate coordination  ? ? ?Vanetta Mulders ?05/18/2021, 7:53 AM ? ?

## 2021-05-18 NOTE — Progress Notes (Addendum)
[facility] PROGRESS NOTE    JIMARI POMRENKE  [id number] DOB: 30-Apr-1934 DOA: 05/11/2021 PCP: Dineen Kid, MD     HPI Marcus Mcgee is an 87 y.o. male past medical history significant for HTN, pacemaker with history arrhythmia, history of CVA who presents by EMS after a fall after this he noticed deformity of his right thigh EMS was called according to his wife he has been having difficulty with balance and has had couple of falls with no injuries.  He saw neurologist the day prior to admission as he was having memory difficulties.  Family relates he has been falling for the last several months. Orthopedic surgery was consulted, had ORIF on 05/12/21.    Assessment/plan  Closed fracture of right femur, initial encounter [facility]): Orthopedic surgery consulted, status post ORIF of the distal femur on 05/12/2021 Pain management, DVT PPx per orthopedics (holding narcotics) Continue ASA 325 mg as DVT ppx PT/OT- SNF  Acute blood loss anemia Postop surgical hemoglobin 5.9, transfused 2 units of PRBC--> 7-->6.8 transfused 1U of PRBC on 05/15/21-->8.2-->9.1 As per orthopedics, expected blood loss Daily CBC  Acute metabolic encephalopathy Improving Likely due to Lyrica, trazodone, narcotics, severe anemia plus or minus anesthetic during the surgery, underlying ?dementia, hospital delirium Monitor closely, delirium precautions  Leukocytosis Currently afebrile UA/UC pending BC x2 pending Chest x-ray unremarkable Daily CBC  Persistent nausea/poor oral intake DG abdomen x-ray unremarkable CT abdomen/pelvis showed cholelithiasis, diverticulosis without inflammation RUQ USS pending As needed Compazine (history of prolonged QTc on flecainide) for nausea Monitor closely  Hyponatremia Likely 2/2 poor oral intake Start IV fluids, gentle hydration Daily BMP  Hypokalemia Replace as needed  Acute kidney injury Resolved Likely prerenal azotemia  Essential  hypertension Stable Continue IV hydralazine as needed  COPD Stable, continue inhalers  Prolonged QTc Continue flecainide  History of CVA CTA head and neck with no large vessel occlusion, unchanged severe stenosis of the right PCA P1 segment and unchanged also on the left 2D echo EF of 65% with grade 1 diastolic heart failure no wall motion abnormality.  History of nonobstructive CAD Cath in May 2010 Cont. aspirin  History of pacemaker  BPH Proscar and Flomax  History of OSA Noncompliant to CPAP Trial of CPAP at bedtime    DVT prophylaxis: ASA as per ortho Family Communication: Wife at bedside Status is: Inpatient Remains inpatient appropriate because: Level of care      Code Status:     Code Status Orders  (From admission, onward)           Start     Ordered   05/11/21 2322  Full code  Continuous        05/11/21 2321           Code Status History     This patient has a current code status but no historical code status.      Advance Directive Documentation    [place] Most Recent Value  Type of Advance Directive Living will  Pre-existing out of facility DNR order (yellow form or pink MOST form) --  "MOST" Form in Place? --         IV Access:   Peripheral IV   Procedures and diagnostic studies:   DG Chest Port 1 View  Result Date: 05/18/2021 CLINICAL DATA:  Leukocytosis. Postop from internal fixation of femur fracture. EXAM: PORTABLE CHEST 1 VIEW COMPARISON:  05/11/2021 FINDINGS: The heart size and mediastinal contours are within normal limits. Permanent pacemaker remains in  appropriate position. Aortic atherosclerotic calcification noted. Both lungs are clear. Postop or posttraumatic changes are again seen involving the right 4th rib. IMPRESSION: No active disease. Electronically Signed   By: Marlaine Hind M.D.   On: 05/18/2021 08:34   DG Abd Portable 1V  Result Date: 05/17/2021 CLINICAL DATA:  Nausea and vomiting EXAM: PORTABLE  ABDOMEN - 1 VIEW COMPARISON:  None. FINDINGS: Bowel gas pattern is nonspecific. Gas is present in colon. There is small amount of stool in colon without signs of fecal impaction. No abnormal masses or calcifications are seen. Kidneys are mostly obscured by bowel contents. There is arthroplasty in both hips. Degenerative changes are noted in lumbar spine. IMPRESSION: Nonspecific bowel gas pattern. Electronically Signed   By: Elmer Picker M.D.   On: 05/17/2021 16:04     Medical Consultants:   Orthopedics   Subjective:    Still complaining of significant nausea denies any vomiting, mild generalized abdominal pain, poor oral intake.  Noted significant sleep apnea   Objective:    Vitals:   05/17/21 1327 05/17/21 1520 05/17/21 2020 05/18/21 0732  BP: (!) 162/86 (!) 161/84 (!) 152/77 (!) 144/87  Pulse: 82 89 76   Resp: 14 17 13    Temp:  98.2 F (36.8 C) 98.3 F (36.8 C) 98 F (36.7 C)  TempSrc:  Oral Oral   SpO2: 96% 100% 99%   Weight:      Height:       SpO2: 99 % O2 Flow Rate (L/min): 2 L/min   Intake/Output Summary (Last 24 hours) at 05/18/2021 1530 Last data filed at 05/18/2021 0900 Gross per 24 hour  Intake --  Output 2450 ml  Net -2450 ml    Filed Weights   05/11/21 2152  Weight: 90.7 kg    Exam: General: NAD, awake, alert, noted significant snoring while sleeping this a.m. Cardiovascular: S1, S2 present Respiratory: CTAB Abdomen: Soft, nontender, nondistended, bowel sounds present Musculoskeletal: No bilateral pedal edema noted, RLE dressing C/D/I Skin: Normal Psychiatry: Normal mood   Data Reviewed:    Labs: Basic Metabolic Panel: Recent Labs  Lab 05/13/21 0800 05/14/21 0159 05/15/21 0808 05/15/21 1019 05/16/21 0151 05/17/21 0620 05/18/21 0247  NA  --  131* 134*  --  134* 133* 127*  K  --  4.2 3.6  --  3.4* 3.7 3.3*  CL  --  99 103  --  102 101 97*  CO2  --  23 24  --  23 24 21*  GLUCOSE  --  144* 130*  --  130* 132* 136*  BUN  --  18  17  --  16 10 13   CREATININE  --  1.13 0.96  --  0.85 0.75 0.74  CALCIUM  --  7.9* 8.0*  --  8.1* 8.5* 8.6*  MG 1.4* 1.6*  --  1.8  --   --  1.8   GFR Estimated Creatinine Clearance: 82.2 mL/min (by C-G formula based on SCr of 0.74 mg/dL). Liver Function Tests: No results for input(s): AST, ALT, ALKPHOS, BILITOT, PROT, ALBUMIN in the last 168 hours.  No results for input(s): LIPASE, AMYLASE in the last 168 hours. No results for input(s): AMMONIA in the last 168 hours. Coagulation profile Recent Labs  Lab 05/11/21 2132  INR 1.0   COVID-19 Labs  No results for input(s): DDIMER, FERRITIN, LDH, CRP in the last 72 hours.  Lab Results  Component Value Date   SARSCOV2NAA NEGATIVE 05/11/2021    CBC: Recent Labs  Lab 05/15/21 0808 05/15/21 1836 05/16/21 0151 05/17/21 0620 05/18/21 0247  WBC 8.9 9.7 8.9 8.7 12.4*  NEUTROABS 7.4  --  7.2 6.8 10.2*  HGB 6.8* 7.7* 8.2* 9.1* 9.6*  HCT 19.4* 22.9* 24.1* 26.2* 27.4*  MCV 89.4 88.8 88.9 87.3 85.9  PLT 155 166 183 249 288   Cardiac Enzymes: No results for input(s): CKTOTAL, CKMB, CKMBINDEX, TROPONINI in the last 168 hours. BNP (last 3 results) No results for input(s): PROBNP in the last 8760 hours. CBG: No results for input(s): GLUCAP in the last 168 hours. D-Dimer: No results for input(s): DDIMER in the last 72 hours. Hgb A1c: No results for input(s): HGBA1C in the last 72 hours. Lipid Profile: No results for input(s): CHOL, HDL, LDLCALC, TRIG, CHOLHDL, LDLDIRECT in the last 72 hours. Thyroid function studies: No results for input(s): TSH, T4TOTAL, T3FREE, THYROIDAB in the last 72 hours.  Invalid input(s): FREET3 Anemia work up: No results for input(s): VITAMINB12, FOLATE, FERRITIN, TIBC, IRON, RETICCTPCT in the last 72 hours. Sepsis Labs: Recent Labs  Lab 05/11/21 2132 05/12/21 0544 05/15/21 1836 05/16/21 0151 05/17/21 0620 05/18/21 0247  WBC  --    < > 9.7 8.9 8.7 12.4*  LATICACIDVEN 3.7*  --   --   --   --   --     < > = values in this interval not displayed.   Microbiology Recent Results (from the past 240 hour(s))  Resp Panel by RT-PCR (Flu A&B, Covid) Nasopharyngeal Swab     Status: None   Collection Time: 05/11/21  9:32 PM   Specimen: Nasopharyngeal Swab; Nasopharyngeal(NP) swabs in vial transport medium  Result Value Ref Range Status   SARS Coronavirus 2 by RT PCR NEGATIVE NEGATIVE Final    Comment: (NOTE) SARS-CoV-2 target nucleic acids are NOT DETECTED.  The SARS-CoV-2 RNA is generally detectable in upper respiratory specimens during the acute phase of infection. The lowest concentration of SARS-CoV-2 viral copies this assay can detect is 138 copies/mL. A negative result does not preclude SARS-Cov-2 infection and should not be used as the sole basis for treatment or other patient management decisions. A negative result may occur with  improper specimen collection/handling, submission of specimen other than nasopharyngeal swab, presence of viral mutation(s) within the areas targeted by this assay, and inadequate number of viral copies(<138 copies/mL). A negative result must be combined with clinical observations, patient history, and epidemiological information. The expected result is Negative.  Fact Sheet for Patients:  EntrepreneurPulse.com.au  Fact Sheet for Healthcare Providers:  IncredibleEmployment.be  This test is no t yet approved or cleared by the [place] FDA and  has been authorized for detection and/or diagnosis of SARS-CoV-2 by FDA under an Emergency Use Authorization (EUA). This EUA will remain  in effect (meaning this test can be used) for the duration of the COVID-19 declaration under Section 564(b)(1) of the Act, 21 U.S.C.section 360bbb-3(b)(1), unless the authorization is terminated  or revoked sooner.       Influenza A by PCR NEGATIVE NEGATIVE Final   Influenza B by PCR NEGATIVE NEGATIVE Final    Comment: (NOTE) The  Xpert Xpress SARS-CoV-2/FLU/RSV plus assay is intended as an aid in the diagnosis of influenza from Nasopharyngeal swab specimens and should not be used as a sole basis for treatment. Nasal washings and aspirates are unacceptable for Xpert Xpress SARS-CoV-2/FLU/RSV testing.  Fact Sheet for Patients: EntrepreneurPulse.com.au  Fact Sheet for Healthcare Providers: IncredibleEmployment.be  This test is not yet approved or cleared by the  [place] Architectural technologist and has been authorized for detection and/or diagnosis of SARS-CoV-2 by FDA under an Print production planner (EUA). This EUA will remain in effect (meaning this test can be used) for the duration of the COVID-19 declaration under Section 564(b)(1) of the Act, 21 U.S.C. section 360bbb-3(b)(1), unless the authorization is terminated or revoked.  Performed at [facility], [place] [address] [place], [place] [address]      Medications:    aspirin  300 mg Rectal Daily   Or   aspirin EC  325 mg Oral Daily   atorvastatin  40 mg Oral QHS   feeding supplement  237 mL Oral BID BM   finasteride  5 mg Oral QHS   flecainide  100 mg Oral BID   pantoprazole  40 mg Oral Daily   pregabalin  100 mg Oral BID   sertraline  100 mg Oral q morning   Continuous Infusions:  sodium chloride 75 mL/hr at 05/18/21 0954       LOS: 7 days   Alma Friendly, MD  [facility]  05/18/2021, 3:30 PM

## 2021-05-18 NOTE — TOC Progression Note (Addendum)
Transition of Care (TOC) - Progression Note  ? ? ?Patient Details  ?Name: Marcus Mcgee ?MRN: [id number] ?Date of Birth: 27-Jun-1934 ? ?Transition of Care (TOC) CM/SW Contact  ?Lorri Frederick, LCSW ?Phone Number: ?05/18/2021, 8:59 AM ? ?Clinical Narrative:   [name] at [facility] informed CSW that they cannot offer a bed.  CSW spoke with [name] and shared this information and she reports she is going to call and speak to [facility] about the reason for the decline. ? ?1100: CSW spoke with pt wife.  She reports that she spoke to [facility] and the reason she was given is that pt is not yet ready for PT and should not be leaving the hospital until his mobility has improved.  Wife reports that she wants to choose a bed a [facility] and wants pt to remain in the hospital until his mobility is better.  She also reports she is calling a patient advocate and "escalating this" because what is happening is not OK. ? ?1600: TC from [name].  She called to say she has reviewed the notes in epic and wanted to inform CSW that she did not say what the wife attributed to her.  [name] reports that she has received long phone calls from both the wife and the son of the pt and will not be offering a bed to this pt.   ? ?Expected Discharge Plan: Skilled Nursing Facility ?Barriers to Discharge: Continued Medical Work up ? ?Expected Discharge Plan and Services ?Expected Discharge Plan: Skilled Nursing Facility ?  ?Discharge Planning Services: CM Consult ?Post Acute Care Choice: Durable Medical Equipment (hospital bed) ?Living arrangements for the past 2 months: Single Family Home ?                ?  ?  ?  ?  ?  ?  ?HH Agency: [facility] ?Date HH Agency Contacted: 05/13/21 ?Time HH Agency Contacted: 1459 ?Representative spoke with at [facility] Agency: Barbara Cower ? ? ?Social Determinants of Health (SDOH) Interventions ?  ? ?Readmission Risk Interventions ?No flowsheet data found. ? ?

## 2021-05-19 DIAGNOSIS — G9341 Metabolic encephalopathy: Secondary | ICD-10-CM

## 2021-05-19 DIAGNOSIS — D62 Acute posthemorrhagic anemia: Secondary | ICD-10-CM

## 2021-05-19 DIAGNOSIS — S7291XA Unspecified fracture of right femur, initial encounter for closed fracture: Secondary | ICD-10-CM | POA: Diagnosis not present

## 2021-05-19 LAB — CBC WITH DIFFERENTIAL/PLATELET
Abs Immature Granulocytes: 0.18 10*3/uL — ABNORMAL HIGH (ref 0.00–0.07)
Basophils Absolute: 0 10*3/uL (ref 0.0–0.1)
Basophils Relative: 0 %
Eosinophils Absolute: 0.1 10*3/uL (ref 0.0–0.5)
Eosinophils Relative: 1 %
HCT: 27.8 % — ABNORMAL LOW (ref 39.0–52.0)
Hemoglobin: 9.3 g/dL — ABNORMAL LOW (ref 13.0–17.0)
Immature Granulocytes: 2 %
Lymphocytes Relative: 11 %
Lymphs Abs: 1.3 10*3/uL (ref 0.7–4.0)
MCH: 29.4 pg (ref 26.0–34.0)
MCHC: 33.5 g/dL (ref 30.0–36.0)
MCV: 88 fL (ref 80.0–100.0)
Monocytes Absolute: 0.7 10*3/uL (ref 0.1–1.0)
Monocytes Relative: 6 %
Neutro Abs: 9.7 10*3/uL — ABNORMAL HIGH (ref 1.7–7.7)
Neutrophils Relative %: 80 %
Platelets: 356 10*3/uL (ref 150–400)
RBC: 3.16 MIL/uL — ABNORMAL LOW (ref 4.22–5.81)
RDW: 14.7 % (ref 11.5–15.5)
WBC: 12 10*3/uL — ABNORMAL HIGH (ref 4.0–10.5)
nRBC: 0 % (ref 0.0–0.2)

## 2021-05-19 LAB — COMPREHENSIVE METABOLIC PANEL
ALT: 30 U/L (ref 0–44)
AST: 32 U/L (ref 15–41)
Albumin: 2.8 g/dL — ABNORMAL LOW (ref 3.5–5.0)
Alkaline Phosphatase: 67 U/L (ref 38–126)
Anion gap: 10 (ref 5–15)
BUN: 24 mg/dL — ABNORMAL HIGH (ref 8–23)
CO2: 21 mmol/L — ABNORMAL LOW (ref 22–32)
Calcium: 8.5 mg/dL — ABNORMAL LOW (ref 8.9–10.3)
Chloride: 99 mmol/L (ref 98–111)
Creatinine, Ser: 0.93 mg/dL (ref 0.61–1.24)
GFR, Estimated: >60 mL/min (ref >60)
Glucose, Bld: 134 mg/dL — ABNORMAL HIGH (ref 70–99)
Potassium: 3.3 mmol/L — ABNORMAL LOW (ref 3.5–5.1)
Sodium: 130 mmol/L — ABNORMAL LOW (ref 135–145)
Total Bilirubin: 1 mg/dL (ref 0.3–1.2)
Total Protein: 6 g/dL — ABNORMAL LOW (ref 6.5–8.1)

## 2021-05-19 MED ORDER — POTASSIUM CHLORIDE CRYS ER 20 MEQ PO TBCR
40.0000 meq | EXTENDED_RELEASE_TABLET | Freq: Once | ORAL | Status: AC
  Administered 2021-05-19: 40 meq via ORAL
  Filled 2021-05-19: qty 2

## 2021-05-19 NOTE — TOC Progression Note (Addendum)
Transition of Care (TOC) - Progression Note  ? ? ?Patient Details  ?Name: Marcus Mcgee ?MRN: [id number] ?Date of Birth: [birth year] ? ?Transition of Care (TOC) CM/SW Contact  ?Marcus Chars, LCSW ?Phone Number: ?05/19/2021, 10:29 AM ? ?Clinical Narrative:   CSW informed by MD to plan for DC tomorrow.  CSW spoke with pt and wife Marcus Mcgee, discussed the above.  Wife continues to state that she has spoken to multiple SNF facilities who have all told her that pt is not ready for DC if he cannot walk.  Wife states she informed the MD and will inform CSW as well that pt will not leave until he is able to walk.  CSW shared the process if a DC order is put in, if there is disagreement about the DC, and their ability to appeal and the process that then takes place.  Wife states she is not concerned with DC order as pt will not be leaving.  Wife also states that she does not like CSW's "attitude." ? ?CSW then discussed the current bed offers.  Wife reports that her plan is for pt to DC to [facility] when ready.  CSW shared that [facility] has informed CSW that they will not be making a bed offer, which wife again disputed.  CSW discussed that pt does have bed offers, went over them, wife is declining all of the current offers.  Wife asked CSW to check with [facility] and [facility]. ? ?CSW reached out to [facility] and [facility] and asked them to review referral.   ? ?Both [facility] do not offer beds.   ? ?1300: CSW informed by RN that pt son now here and that he has "fired" CSW.  RNCM Marcus Mcgee will take over meeting with family. CSW updated Marcus Mcgee on current status with bed offers.   ? ?1500: Marcus Mcgee met with family and family does accept offer at [facility]  CSW confirmed with Marcus Mcgee at [facility] and they can accept pt tomorrow.  CSW initiated new [place] in [place] for [employer].   ? ?  ? ?CSW informed by Marcus Mcgee ? ?Expected Discharge Plan: [place] ?Barriers to Discharge: Continued Medical  Work up ? ?Expected Discharge Plan and Services ?Expected Discharge Plan: [place] ?  ?Discharge Planning Services: CM Consult ?Post Acute Care Choice: Durable Medical Equipment (hospital bed) ?Living arrangements for the past 2 months: [place] ?                ?  ?  ?  ?  ?  ?  ?[place] Agency: [facility] ?Date HH Agency Contacted: 05/13/21 ?Time [place]: 2230 ?Representative spoke with at [place]: Marcus Mcgee ? ? ?Social Determinants of Health (SDOH) Interventions ?  ? ?Readmission Risk Interventions ?No flowsheet data found. ? ?

## 2021-05-19 NOTE — Assessment & Plan Note (Addendum)
Likely due to Lyrica, trazodone, narcotics, severe anemia plus or minus anesthetic during the surgery, underlying ?dementia, hospital delirium ?Currently alert and oriented ? ?

## 2021-05-19 NOTE — Progress Notes (Signed)
Physical Therapy Treatment ?Patient Details ?Name: Marcus Mcgee ?MRN: [id number] ?DOB: 09-Nov-1934 ?Today's Date: 05/19/2021 ? ? ?History of Present Illness 87 yo admitted 2/28 after fall at home with Rt femur fx s/p ORIF 3/1. PMhx: bil TKA, bil THA, HTN, cOPD, CVA ? ?  ?PT Comments  ? ? Pt making slow progress towards goals with limiting factor continuing to be increased nausea, with pt vomiting once sitting EOB this session and unable to continue progression of OOB. Pt requiring less assist to come to sitting EOB, tolerating chair position well first before initiating. Pt with good tolerance for LE therex for increased strength and ROM. Pt continues to benefit from skilled PT services to progress toward functional mobility goals.  ?  ?Recommendations for follow up therapy are one component of a multi-disciplinary discharge planning process, led by the attending physician.  Recommendations may be updated based on patient status, additional functional criteria and insurance authorization. ? ?Follow Up Recommendations ? Skilled nursing-short term rehab (<3 hours/day) ?  ?  ?Assistance Recommended at Discharge Frequent or constant Supervision/Assistance  ?Patient can return home with the following Two people to help with walking and/or transfers;Two people to help with bathing/dressing/bathroom;Assist for transportation;Help with stairs or ramp for entrance;Direct supervision/assist for medications management;Assistance with cooking/housework ?  ?Equipment Recommendations ? None recommended by PT  ?  ?Recommendations for Other Services   ? ? ?  ?Precautions / Restrictions Precautions ?Precautions: Fall ?Restrictions ?Weight Bearing Restrictions: Yes ?RLE Weight Bearing: Non weight bearing ?Other Position/Activity Restrictions: Pt now has a short boot on LLE but no reported WB changes for sprain ankle  ?  ? ?Mobility ? Bed Mobility ?Overal bed mobility: Needs Assistance ?Bed Mobility: Rolling, Supine to Sit, Sit to  Supine ?Rolling: Supervision ?  ?Supine to sit: Mod assist ?Sit to supine: Min assist ?  ?General bed mobility comments: placed bed in chair position prior to sitting EOB, assist for R LE over EOB and hips to EOB with bed pad, pt completed grooming with supervision at EOB and then vomited, returned to supine with assist for R LE back into bed, +2 total to pull up in bed ?  ? ?Transfers ?  ?  ?  ?  ?  ?  ?  ?  ?  ?General transfer comment: pt reporting he could not continue with mobility OOB after vomiting ?  ? ?Ambulation/Gait ?  ?  ?  ?  ?  ?  ?  ?  ? ? ?Stairs ?  ?  ?  ?  ?  ? ? ?Wheelchair Mobility ?  ? ?Modified Rankin (Stroke Patients Only) ?  ? ? ?  ?Balance Overall balance assessment: Needs assistance ?Sitting-balance support: No upper extremity supported ?Sitting balance-Leahy Scale: Fair ?Sitting balance - Comments: able to participate in ADLs in unsupported sitting ?  ?  ?  ?  ?  ?  ?  ?  ?  ?  ?  ?  ?  ?  ?  ?  ? ?  ?Cognition Arousal/Alertness: Awake/alert ?Behavior During Therapy: [facility] for tasks assessed/performed ?Overall Cognitive Status: Impaired/Different from baseline ?Area of Impairment: Following commands, Memory, Attention, Awareness, Problem solving ?  ?  ?  ?  ?  ?  ?  ?  ?  ?Current Attention Level: Sustained ?Memory: Decreased short-term memory ?Following Commands: Follows one step commands with increased time ?  ?  ?Problem Solving: Slow processing, Decreased initiation, Difficulty sequencing, Requires verbal cues, Requires tactile cues ?General  Comments: cognition continues to improve ?  ?  ? ?  ?Exercises General Exercises - Lower Extremity ?Ankle Circles/Pumps: AROM, Both, 20 reps, Supine ?Long Arc Quad: AROM, Right, Left, 10 reps, Seated ?Hip Flexion/Marching: AROM, Right, Left, 10 reps, Seated ? ?  ?General Comments   ?  ?  ? ?Pertinent Vitals/Pain Pain Assessment ?Pain Assessment: Faces ?Faces Pain Scale: Hurts little more ?Pain Location: R LE ?Pain Descriptors / Indicators: Grimacing,  Guarding, Sore, Moaning ?Pain Intervention(s): Limited activity within patient's tolerance, Monitored during session, Repositioned  ? ? ?Home Living   ?  ?  ?  ?  ?  ?  ?  ?  ?  ?   ?  ?Prior Function    ?  ?  ?   ? ?PT Goals (current goals can now be found in the care plan section) Acute Rehab PT Goals ?PT Goal Formulation: With patient/family ?Time For Goal Achievement: 05/27/21 ? ?  ?Frequency ? ? ? Min 3X/week ? ? ? ?  ?PT Plan    ? ? ?Co-evaluation PT/OT/SLP Co-Evaluation/Treatment: Yes ?Reason for Co-Treatment: For patient/therapist safety ?PT goals addressed during session: Mobility/safety with mobility;Balance;Strengthening/ROM ?OT goals addressed during session: ADL's and self-care ?  ? ?  ?AM-PAC PT "6 Clicks" Mobility   ?Outcome Measure ? Help needed turning from your back to your side while in a flat bed without using bedrails?: A Lot ?Help needed moving from lying on your back to sitting on the side of a flat bed without using bedrails?: A Lot ?Help needed moving to and from a bed to a chair (including a wheelchair)?: A Lot ?Help needed standing up from a chair using your arms (e.g., wheelchair or bedside chair)?: Total ?Help needed to walk in hospital room?: Total ?Help needed climbing 3-5 steps with a railing? : Total ?6 Click Score: 9 ? ?  ?End of Session   ?Activity Tolerance: Patient limited by pain;Other (comment) (limited by nausea) ?Patient left: in bed;with call bell/phone within reach;with bed alarm set;with family/visitor present ?Nurse Communication: Mobility status (pt vomiting) ?PT Visit Diagnosis: Other abnormalities of gait and mobility (R26.89);Repeated falls (R29.6);Muscle weakness (generalized) (M62.81);Unsteadiness on feet (R26.81);Pain ?Pain - Right/Left: Right ?Pain - part of body: Leg ?  ? ? ?Time: [id number] ?PT Time Calculation (min) (ACUTE ONLY): 34 min ? ?Charges:  $Therapeutic Exercise: 8-22 mins ?$Therapeutic Activity: 8-22 mins          ?          ? ?Audry Riles. PTA ?Acute  Rehabilitation Services ?Office: [phone] ? ? ? ?Betsey Holiday Indica Marcott ?05/19/2021, 2:14 PM ? ?

## 2021-05-19 NOTE — Assessment & Plan Note (Addendum)
Postop surgical hemoglobin 5.9, transfused 2 units of PRBC--> 7-->6.8 transfused 1U of PRBC on 05/15/21 ?Currently hemoglobin stable in the range of 9.  Monitor CBC ?

## 2021-05-19 NOTE — Progress Notes (Signed)
PROGRESS NOTE  Marcus Mcgee  [id number] DOB: 1934-05-30 DOA: 05/11/2021 PCP: Dineen Kid, MD   Brief Narrative:  Patient is 87 y.o. male past medical history significant for HTN, pacemaker with history arrhythmia, history of CVA who presents by EMS after a fall after this he noticed deformity of his right thigh EMS was called according to his wife he has been having difficulty with balance and has had couple of falls with no injuries. .  Family relates he has been falling for the last several months.  He was found to have fracture of the right femur.  Orthopedic surgery was consulted, had ORIF on 05/12/21.  Hospital course remarkable for acute blood loss anemia, confusion.  PT/OT recommending SNF on  discharge.  Waiting for further improvement in the encephalopathy and mobility before discharge SN facility.  TOC following  Assessment & Plan:  Principal Problem:   Closed fracture of right femur, unspecified fracture morphology, initial encounter [facility]) Active Problems:   Hyponatremia   Pacemaker   Essential hypertension   COPD (chronic obstructive pulmonary disease) (HCC)   Prolonged QT interval   History of CVA (cerebrovascular accident)   Acute metabolic encephalopathy   Acute postoperative anemia due to expected blood loss   Assessment and Plan: * Closed fracture of right femur, unspecified fracture morphology, initial encounter [facility]) Orthopedic surgery consulted, status post ORIF of the distal femur on 05/12/2021 Pain management, DVT PPx per orthopedics (holding narcotics) Continue ASA 325 mg as DVT ppx PT/OT- SNF Patient also sprained his left ankle, currently on a short boot  Acute postoperative anemia due to expected blood loss Postop surgical hemoglobin 5.9, transfused 2 units of PRBC--> 7-->6.8 transfused 1U of PRBC on 05/15/21 Currently hemoglobin stable in the range of 9.  Monitor CBC  Acute metabolic encephalopathy Improving Likely due to Lyrica, trazodone, narcotics,  severe anemia plus or minus anesthetic during the surgery, underlying ?dementia, hospital delirium Monitor closely, delirium precautions   History of CVA (cerebrovascular accident) CTA head and neck with no large vessel occlusion, unchanged severe stenosis of the right PCA P1 segment and unchanged also on the left 2D echo EF of 65% with grade 1 diastolic heart failure no wall motion abnormality.    Prolonged QT interval Avoid medications which could further prolong QT interval.On flecainide  COPD (chronic obstructive pulmonary disease) (HCC) Currently stable, continue bronchodilators as needed.  Essential hypertension Monitor BP.  Continue current medications  Pacemaker stable  Hyponatremia Improving              DVT prophylaxis:SCDs Start: 05/11/21 2322     Code Status: Full Code  Family Communication: Wife at bedside  Patient status:Inpatient  Patient is from :Home  Anticipated discharge to:SNF  Estimated DC date: Wife adamant about refusing to leave because she is waiting for improvement in the mobility.  Most likely tomorrow   Consultants: Orthopedics  Procedures: ORIF  Antimicrobials:  Anti-infectives (From admission, onward)    Start     Dose/Rate Route Frequency Ordered Stop   05/13/21 0600  ceFAZolin (ANCEF) IVPB 2g/100 mL premix        2 g 200 mL/hr over 30 Minutes Intravenous On call to O.R. 05/12/21 1659 05/13/21 0604   05/12/21 2330  ceFAZolin (ANCEF) IVPB 2g/100 mL premix        2 g 200 mL/hr over 30 Minutes Intravenous Every 8 hours 05/12/21 2237 05/13/21 0557   05/12/21 1943  vancomycin (VANCOCIN) powder  Status:  Discontinued  As needed 05/12/21 1943 05/12/21 2107   05/12/21 1633  ceFAZolin (ANCEF) 2-4 GM/100ML-% IVPB       Note to Pharmacy: Cameron Sprang M: cabinet override      05/12/21 1633 05/13/21 0444       Subjective: Patient seen and examined at the bedside this morning.  Hemodynamically stable.  Comfortable,  alert and oriented today, in bed.  Wife at bedside.  Denies any complaints  Objective: Vitals:   05/18/21 0732 05/18/21 1950 05/18/21 2312 05/19/21 0743  BP: (!) 144/87 124/77  104/66  Pulse:  79 71 77  Resp:  14 18 18   Temp: 98 F (36.7 C) 97.8 F (36.6 C)  97.9 F (36.6 C)  TempSrc:  Oral  Oral  SpO2:  98% 96% 98%  Weight:      Height:        Intake/Output Summary (Last 24 hours) at 05/19/2021 [id number] Last data filed at 05/19/2021 [id number] Gross per 24 hour  Intake --  Output 500 ml  Net -500 ml   Filed Weights   05/11/21 2152  Weight: 90.7 kg    Examination:  General exam: Overall comfortable, not in distress HEENT: PERRL Respiratory system:  no wheezes or crackles  Cardiovascular system: S1 & S2 heard, RRR.  Gastrointestinal system: Abdomen is nondistended, soft and nontender. Central nervous system: Alert and oriented Extremities: No edema, no clubbing ,no cyanosis, surgical wound on the right thigh, short boot on the left leg Skin: No rashes, no ulcers,no icterus     Data Reviewed: I have personally reviewed following labs and imaging studies  CBC: Recent Labs  Lab 05/15/21 0808 05/15/21 1836 05/16/21 0151 05/17/21 0620 05/18/21 0247 05/19/21 0629  WBC 8.9 9.7 8.9 8.7 12.4* 12.0*  NEUTROABS 7.4  --  7.2 6.8 10.2* 9.7*  HGB 6.8* 7.7* 8.2* 9.1* 9.6* 9.3*  HCT 19.4* 22.9* 24.1* 26.2* 27.4* 27.8*  MCV 89.4 88.8 88.9 87.3 85.9 88.0  PLT 155 166 183 249 288 [id number]   Basic Metabolic Panel: Recent Labs  Lab 05/12/21 1957 05/13/21 0800 05/14/21 0159 05/15/21 0808 05/15/21 1019 05/16/21 0151 05/17/21 0620 05/18/21 0247 05/19/21 0631  NA  --   --  131* 134*  --  134* 133* 127* 130*  K  --   --  4.2 3.6  --  3.4* 3.7 3.3* 3.3*  CL  --   --  99 103  --  102 101 97* 99  CO2   < >  --  23 24  --  23 24 21* 21*  GLUCOSE  --   --  144* 130*  --  130* 132* 136* 134*  BUN  --   --  18 17  --  16 10 13  24*  CREATININE  --   --  1.13 0.96  --  0.85 0.75 0.74 0.93   CALCIUM   < >  --  7.9* 8.0*  --  8.1* 8.5* 8.6* 8.5*  MG  --  1.4* 1.6*  --  1.8  --   --  1.8  --    < > = values in this interval not displayed.     Recent Results (from the past 240 hour(s))  Resp Panel by RT-PCR (Flu A&B, Covid) Nasopharyngeal Swab     Status: None   Collection Time: 05/11/21  9:32 PM   Specimen: Nasopharyngeal Swab; Nasopharyngeal(NP) swabs in vial transport medium  Result Value Ref Range Status   SARS Coronavirus 2 by RT PCR  NEGATIVE NEGATIVE Final    Comment: (NOTE) SARS-CoV-2 target nucleic acids are NOT DETECTED.  The SARS-CoV-2 RNA is generally detectable in upper respiratory specimens during the acute phase of infection. The lowest concentration of SARS-CoV-2 viral copies this assay can detect is 138 copies/mL. A negative result does not preclude SARS-Cov-2 infection and should not be used as the sole basis for treatment or other patient management decisions. A negative result may occur with  improper specimen collection/handling, submission of specimen other than nasopharyngeal swab, presence of viral mutation(s) within the areas targeted by this assay, and inadequate number of viral copies(<138 copies/mL). A negative result must be combined with clinical observations, patient history, and epidemiological information. The expected result is Negative.  Fact Sheet for Patients:  BloggerCourse.com  Fact Sheet for Healthcare Providers:  SeriousBroker.it  This test is no t yet approved or cleared by the [place] FDA and  has been authorized for detection and/or diagnosis of SARS-CoV-2 by FDA under an Emergency Use Authorization (EUA). This EUA will remain  in effect (meaning this test can be used) for the duration of the COVID-19 declaration under Section 564(b)(1) of the Act, 21 U.S.C.section 360bbb-3(b)(1), unless the authorization is terminated  or revoked sooner.       Influenza A by PCR  NEGATIVE NEGATIVE Final   Influenza B by PCR NEGATIVE NEGATIVE Final    Comment: (NOTE) The Xpert Xpress SARS-CoV-2/FLU/RSV plus assay is intended as an aid in the diagnosis of influenza from Nasopharyngeal swab specimens and should not be used as a sole basis for treatment. Nasal washings and aspirates are unacceptable for Xpert Xpress SARS-CoV-2/FLU/RSV testing.  Fact Sheet for Patients: BloggerCourse.com  Fact Sheet for Healthcare Providers: SeriousBroker.it  This test is not yet approved or cleared by the [place] FDA and has been authorized for detection and/or diagnosis of SARS-CoV-2 by FDA under an Emergency Use Authorization (EUA). This EUA will remain in effect (meaning this test can be used) for the duration of the COVID-19 declaration under Section 564(b)(1) of the Act, 21 U.S.C. section 360bbb-3(b)(1), unless the authorization is terminated or revoked.  Performed at [facility], [address], [place], [place] [address]   Culture, blood (routine x 2)     Status: None (Preliminary result)   Collection Time: 05/19/21  6:29 AM   Specimen: BLOOD LEFT ARM  Result Value Ref Range Status   Specimen Description BLOOD LEFT ARM  Final   Special Requests   Final    BOTTLES DRAWN AEROBIC AND ANAEROBIC Blood Culture adequate volume   Culture   Final    NO GROWTH <12 HOURS Performed at [facility], [address] [place], [place] [address]    Report Status PENDING  Incomplete  Culture, blood (routine x 2)     Status: None (Preliminary result)   Collection Time: 05/19/21  6:36 AM   Specimen: BLOOD LEFT FOREARM  Result Value Ref Range Status   Specimen Description BLOOD LEFT FOREARM  Final   Special Requests   Final    BOTTLES DRAWN AEROBIC AND ANAEROBIC Blood Culture adequate volume   Culture   Final    NO GROWTH <12 HOURS Performed at [facility], [address] [place], [place] [address]     Report Status PENDING  Incomplete     Radiology Studies: CT ABDOMEN PELVIS W CONTRAST  Result Date: 05/18/2021 CLINICAL DATA:  Nausea, vomiting. EXAM: CT ABDOMEN AND PELVIS WITH CONTRAST TECHNIQUE: Multidetector CT  imaging of the abdomen and pelvis was performed using the standard protocol following bolus administration of intravenous contrast. RADIATION DOSE REDUCTION: This exam was performed according to the departmental dose-optimization program which includes automated exposure control, adjustment of the mA and/or kV according to patient size and/or use of iterative reconstruction technique. CONTRAST:  173mL OMNIPAQUE IOHEXOL 300 MG/ML  SOLN COMPARISON:  October 16, 2009. FINDINGS: Lower chest: No acute abnormality. Hepatobiliary: Mild cholelithiasis is noted. No biliary dilatation is noted. The liver is unremarkable. Pancreas: Unremarkable. No pancreatic ductal dilatation or surrounding inflammatory changes. Spleen: Normal in size without focal abnormality. Adrenals/Urinary Tract: Adrenal glands appear normal. Stable left renal cyst. No hydronephrosis or renal obstruction is noted. Urinary bladder is unremarkable. Stomach/Bowel: Stomach appears normal. There is no evidence of bowel obstruction or inflammation. Diverticulosis of descending and sigmoid colon is noted without inflammation. Vascular/Lymphatic: Aortic atherosclerosis. No enlarged abdominal or pelvic lymph nodes. Reproductive: Prostate is unremarkable. Other: No abdominal wall hernia or abnormality. No abdominopelvic ascites. Musculoskeletal: Status post bilateral hip arthroplasties. No acute osseous abnormality is noted. IMPRESSION: Cholelithiasis. Diverticulosis of descending and sigmoid colon without inflammation. No acute abnormality is seen. Aortic Atherosclerosis (ICD10-I70.0). Electronically Signed   By: Marijo Conception M.D.   On: 05/18/2021 16:56   DG Chest Port 1 View  Result Date: 05/18/2021 CLINICAL DATA:  Leukocytosis. Postop from  internal fixation of femur fracture. EXAM: PORTABLE CHEST 1 VIEW COMPARISON:  05/11/2021 FINDINGS: The heart size and mediastinal contours are within normal limits. Permanent pacemaker remains in appropriate position. Aortic atherosclerotic calcification noted. Both lungs are clear. Postop or posttraumatic changes are again seen involving the right 4th rib. IMPRESSION: No active disease. Electronically Signed   By: Marlaine Hind M.D.   On: 05/18/2021 08:34   DG Abd Portable 1V  Result Date: 05/17/2021 CLINICAL DATA:  Nausea and vomiting EXAM: PORTABLE ABDOMEN - 1 VIEW COMPARISON:  None. FINDINGS: Bowel gas pattern is nonspecific. Gas is present in colon. There is small amount of stool in colon without signs of fecal impaction. No abnormal masses or calcifications are seen. Kidneys are mostly obscured by bowel contents. There is arthroplasty in both hips. Degenerative changes are noted in lumbar spine. IMPRESSION: Nonspecific bowel gas pattern. Electronically Signed   By: Elmer Picker M.D.   On: 05/17/2021 16:04   US Abdomen Limited RUQ (LIVER/GB)  Result Date: 05/18/2021 CLINICAL DATA:  Nausea and vomiting with known cholelithiasis EXAM: ULTRASOUND ABDOMEN LIMITED RIGHT UPPER QUADRANT COMPARISON:  CT from earlier in the same day. FINDINGS: Gallbladder: Predominately decompressed with multiple gallstones within. Negative sonographic Murphy's sign is elicited. No wall thickening or pericholecystic fluid is noted. Common bile duct: Diameter: 4.6 mm Liver: Mildly increased echogenicity is noted consistent with fatty infiltration. No other focal abnormality is noted. Portal vein is patent on color Doppler imaging with normal direction of blood flow towards the liver. Other: None. IMPRESSION: Cholelithiasis without complicating factors. Mild fatty infiltration of the liver. Electronically Signed   By: Inez Catalina M.D.   On: 05/18/2021 19:59    Scheduled Meds:  aspirin  300 mg Rectal Daily   Or   aspirin  EC  325 mg Oral Daily   atorvastatin  40 mg Oral QHS   feeding supplement  237 mL Oral BID BM   finasteride  5 mg Oral QHS   flecainide  100 mg Oral BID   pantoprazole  40 mg Oral Daily   potassium chloride  40 mEq Oral Once   pregabalin  100 mg Oral BID   sertraline  100 mg Oral q morning   Continuous Infusions:   LOS: 8 days   Shelly Coss, MD [facility] P3/10/2021, 9:38 AM

## 2021-05-19 NOTE — Plan of Care (Signed)
?  Problem: Education: ?Goal: Knowledge of General Education information will improve ?Description: Including pain rating scale, medication(s)/side effects and non-pharmacologic comfort measures ?05/19/2021 0802 by Delmer Islam, RN ?Outcome: Progressing ?05/18/2021 1846 by Delmer Islam, RN ?Outcome: Progressing ?  ?Problem: Activity: ?Goal: Risk for activity intolerance will decrease ?05/19/2021 0802 by Delmer Islam, RN ?Outcome: Progressing ?05/18/2021 1846 by Delmer Islam, RN ?Outcome: Progressing ?  ?Problem: Nutrition: ?Goal: Adequate nutrition will be maintained ?05/19/2021 0802 by Delmer Islam, RN ?Outcome: Progressing ?05/18/2021 1846 by Delmer Islam, RN ?Outcome: Progressing ?  ?Problem: Elimination: ?Goal: Will not experience complications related to bowel motility ?Outcome: Progressing ?  ?Problem: Pain Managment: ?Goal: General experience of comfort will improve ?05/19/2021 0802 by Delmer Islam, RN ?Outcome: Progressing ?05/18/2021 1846 by Delmer Islam, RN ?Outcome: Progressing ?  ?

## 2021-05-19 NOTE — Progress Notes (Signed)
Occupational Therapy Treatment ?Patient Details ?Name: Marcus Mcgee ?MRN: [id number] ?DOB: 06/22/1934 ?Today's Date: 05/19/2021 ? ? ?History of present illness 87 yo admitted 2/28 after fall at home with Rt femur fx s/p ORIF 3/1. PMhx: bil TKA, bil THA, HTN, cOPD, CVA ?  ?OT comments ? Pt is making progress in cognition, bed mobility and sitting balance at EOB, but continues to be limited by lightheadedness and vomiting once he sits EOB for a few minutes and is unable to continue progression OOB. BP monitored throughout, does not appear related to hypotension.   ? ?Recommendations for follow up therapy are one component of a multi-disciplinary discharge planning process, led by the attending physician.  Recommendations may be updated based on patient status, additional functional criteria and insurance authorization. ?   ?Follow Up Recommendations ? Skilled nursing-short term rehab (<3 hours/day)  ?  ?Assistance Recommended at Discharge Frequent or constant Supervision/Assistance  ?Patient can return home with the following ? Two people to help with walking and/or transfers;A lot of help with bathing/dressing/bathroom;Assistance with feeding;Assistance with cooking/housework;Direct supervision/assist for medications management;Direct supervision/assist for financial management;Assist for transportation;Help with stairs or ramp for entrance ?  ?Equipment Recommendations ? Other (comment) (defer to next venue)  ?  ?Recommendations for Other Services   ? ?  ?Precautions / Restrictions Precautions ?Precautions: Fall ?Restrictions ?Weight Bearing Restrictions: Yes ?RLE Weight Bearing: Non weight bearing ?Other Position/Activity Restrictions: Pt now has a short boot on LLE but no reported WB changes for sprain ankle  ? ? ?  ? ?Mobility Bed Mobility ?Overal bed mobility: Needs Assistance ?Bed Mobility: Rolling, Supine to Sit, Sit to Supine ?Rolling: Supervision ?  ?Supine to sit: Mod assist ?Sit to supine: Min assist ?   ?General bed mobility comments: placed bed in chair position prior to sitting EOB, assist for R LE over EOB and hips to EOB with bed pad, pt completed grooming with supervision at EOB and then vomited, returned to supine with assist for R LE back into bed, +2 total to pull up in bed ?  ? ?Transfers ?  ?  ?  ?  ?  ?  ?  ?  ?  ?General transfer comment: pt reporting he could not continue with mobility OOB after vomiting ?  ?  ?Balance Overall balance assessment: Needs assistance ?Sitting-balance support: No upper extremity supported ?Sitting balance-Leahy Scale: Fair ?Sitting balance - Comments: able to participate in ADLs in unsupported sitting ?  ?  ?  ?  ?  ?  ?  ?  ?  ?  ?  ?  ?  ?  ?  ?   ? ?ADL either performed or assessed with clinical judgement  ? ?ADL Overall ADL's : Needs assistance/impaired ?  ?  ?Grooming: Wash/dry hands;Wash/dry face;Oral care;Sitting;Supervision/safety ?  ?  ?  ?  ?  ?  ?  ?Lower Body Dressing: Total assistance;Sitting/lateral leans ?Lower Body Dressing Details (indicate cue type and reason): changed R sock ?  ?  ?Toileting- Clothing Manipulation and Hygiene: Total assistance;Bed level ?  ?  ?  ?  ?General ADL Comments: rolled for bed pan placement ?  ? ?Extremity/Trunk Assessment   ?  ?  ?  ?  ?  ? ?Vision   ?  ?  ?Perception   ?  ?Praxis   ?  ? ?Cognition Arousal/Alertness: Awake/alert ?Behavior During Therapy: [facility] for tasks assessed/performed ?Overall Cognitive Status: Impaired/Different from baseline ?Area of Impairment: Following commands, Memory, Attention, Awareness, Problem  solving ?  ?  ?  ?  ?  ?  ?  ?  ?  ?Current Attention Level: Sustained ?Memory: Decreased short-term memory ?Following Commands: Follows one step commands with increased time ?  ?  ?Problem Solving: Slow processing, Decreased initiation, Difficulty sequencing, Requires verbal cues, Requires tactile cues ?General Comments: cognition continues to improve ?  ?  ?   ?Exercises   ? ?  ?Shoulder Instructions    ? ? ?  ?General Comments    ? ? ?Pertinent Vitals/ Pain       Pain Assessment ?Pain Assessment: Faces ?Faces Pain Scale: Hurts little more ?Pain Location: R LE ?Pain Descriptors / Indicators: Grimacing, Guarding, Sore, Moaning ?Pain Intervention(s): Monitored during session, Repositioned ? ?Home Living   ?  ?  ?  ?  ?  ?  ?  ?  ?  ?  ?  ?  ?  ?  ?  ?  ?  ?  ? ?  ?Prior Functioning/Environment    ?  ?  ?  ?   ? ?Frequency ? Min 2X/week  ? ? ? ? ?  ?Progress Toward Goals ? ?OT Goals(current goals can now be found in the care plan section) ? Progress towards OT goals: Not progressing toward goals - comment ? ?Acute Rehab OT Goals ?OT Goal Formulation: With patient/family ?Time For Goal Achievement: 05/28/21 ?Potential to Achieve Goals: Good  ?Plan Discharge plan remains appropriate   ? ?Co-evaluation ? ? ? PT/OT/SLP Co-Evaluation/Treatment: Yes ?Reason for Co-Treatment: For patient/therapist safety ?  ?OT goals addressed during session: ADL's and self-care ?  ? ?  ?AM-PAC OT "6 Clicks" Daily Activity     ?Outcome Measure ? ? Help from another person eating meals?: A Little ?Help from another person taking care of personal grooming?: A Little ?Help from another person toileting, which includes using toliet, bedpan, or urinal?: Total ?Help from another person bathing (including washing, rinsing, drying)?: A Lot ?Help from another person to put on and taking off regular upper body clothing?: A Lot ?Help from another person to put on and taking off regular lower body clothing?: Total ?6 Click Score: 12 ? ?  ?End of Session   ? ?OT Visit Diagnosis: Pain;Muscle weakness (generalized) (M62.81);Other symptoms and signs involving cognitive function ?  ?Activity Tolerance Treatment limited secondary to medical complications (Comment) (lightheadedness and vomiting) ?  ?Patient Left in bed;with call bell/phone within reach;with bed alarm set;with family/visitor present ?  ?Nurse Communication Other (comment) (aware pt vomited) ?   ? ?   ? ?Time: 9924-2683 ?OT Time Calculation (min): 59 min ? ?Charges: OT General Charges ?$OT Visit: 1 Visit ?OT Treatments ?$Self Care/Home Management : 23-37 mins ? ?Martie Round, OTR/L ?Acute Rehabilitation Services ?Pager: [phone] ?Office: [phone]  ? ?Evern Bio ?05/19/2021, 12:16 PM ?

## 2021-05-20 DIAGNOSIS — S7291XA Unspecified fracture of right femur, initial encounter for closed fracture: Secondary | ICD-10-CM | POA: Diagnosis not present

## 2021-05-20 LAB — CBC WITH DIFFERENTIAL/PLATELET
Abs Immature Granulocytes: 0.19 10*3/uL — ABNORMAL HIGH (ref 0.00–0.07)
Basophils Absolute: 0.1 10*3/uL (ref 0.0–0.1)
Basophils Relative: 1 %
Eosinophils Absolute: 0.2 10*3/uL (ref 0.0–0.5)
Eosinophils Relative: 2 %
HCT: 28.2 % — ABNORMAL LOW (ref 39.0–52.0)
Hemoglobin: 9.2 g/dL — ABNORMAL LOW (ref 13.0–17.0)
Immature Granulocytes: 2 %
Lymphocytes Relative: 16 %
Lymphs Abs: 2 10*3/uL (ref 0.7–4.0)
MCH: 29.5 pg (ref 26.0–34.0)
MCHC: 32.6 g/dL (ref 30.0–36.0)
MCV: 90.4 fL (ref 80.0–100.0)
Monocytes Absolute: 0.9 10*3/uL (ref 0.1–1.0)
Monocytes Relative: 8 %
Neutro Abs: 8.9 10*3/uL — ABNORMAL HIGH (ref 1.7–7.7)
Neutrophils Relative %: 71 %
Platelets: 383 10*3/uL (ref 150–400)
RBC: 3.12 MIL/uL — ABNORMAL LOW (ref 4.22–5.81)
RDW: 15.5 % (ref 11.5–15.5)
WBC: 12.3 10*3/uL — ABNORMAL HIGH (ref 4.0–10.5)
nRBC: 0 % (ref 0.0–0.2)

## 2021-05-20 LAB — BASIC METABOLIC PANEL
Anion gap: 10 (ref 5–15)
BUN: 25 mg/dL — ABNORMAL HIGH (ref 8–23)
CO2: 21 mmol/L — ABNORMAL LOW (ref 22–32)
Calcium: 8.7 mg/dL — ABNORMAL LOW (ref 8.9–10.3)
Chloride: 106 mmol/L (ref 98–111)
Creatinine, Ser: 0.91 mg/dL (ref 0.61–1.24)
GFR, Estimated: >60 mL/min (ref >60)
Glucose, Bld: 125 mg/dL — ABNORMAL HIGH (ref 70–99)
Potassium: 3.8 mmol/L (ref 3.5–5.1)
Sodium: 137 mmol/L (ref 135–145)

## 2021-05-20 LAB — RESP PANEL BY RT-PCR (FLU A&B, COVID) ARPGX2
Influenza A by PCR: NEGATIVE
Influenza B by PCR: NEGATIVE
SARS Coronavirus 2 by RT PCR: NEGATIVE

## 2021-05-20 LAB — URINE CULTURE: Culture: <10000 — AB

## 2021-05-20 MED ORDER — OXYCODONE-ACETAMINOPHEN 5-325 MG PO TABS: 1.0000 | ORAL_TABLET | Freq: Four times a day (QID) | ORAL | 0 refills | Status: AC | PRN

## 2021-05-20 NOTE — Progress Notes (Signed)
PROGRESS NOTE  Marcus Mcgee  AVW:[id number] DOB: 1934-08-15 DOA: 05/11/2021 PCP: Selina Cooley, MD   Brief Narrative:  Patient is 87 y.o. male past medical history significant for HTN, pacemaker with history arrhythmia, history of CVA who presents by EMS after a fall after this he noticed deformity of his right thigh EMS was called according to his wife he has been having difficulty with balance and has had couple of falls with no injuries. .  Family relates he has been falling for the last several months.  He was found to have fracture of the right femur.  Orthopedic surgery was consulted, had ORIF on 05/12/21.  Hospital course remarkable for acute blood loss anemia, confusion.  Mental status has improved.  Hospital course remarkable for slow improvement in the mobility. PT/OT recommending SNF on  discharge.  Medically stable for discharge whenever possible  Assessment & Plan:  Principal Problem:   Closed fracture of right femur, unspecified fracture morphology, initial encounter [facility]) Active Problems:   Hyponatremia   Pacemaker   Essential hypertension   COPD (chronic obstructive pulmonary disease) (HCC)   Prolonged QT interval   History of CVA (cerebrovascular accident)   Acute metabolic encephalopathy   Acute postoperative anemia due to expected blood loss   Assessment and Plan: * Closed fracture of right femur, unspecified fracture morphology, initial encounter [facility]) Orthopedic surgery consulted, status post ORIF of the distal femur on 05/12/2021 Pain management, DVT PPx per orthopedics (holding narcotics) Continue ASA 325 mg as DVT ppx PT/OT- SNF Patient also sprained his left ankle, currently on a short boot  Acute postoperative anemia due to expected blood loss Postop surgical hemoglobin 5.9, transfused 2 units of PRBC--> 7-->6.8 transfused 1U of PRBC on 05/15/21 Currently hemoglobin stable in the range of 9.  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    Prolonged QT interval Avoid medications which could further prolong QT interval.On flecainide  COPD (chronic obstructive pulmonary disease) (HCC) Currently stable, continue bronchodilators as needed.  Essential hypertension Monitor BP.  Continue current medications  Pacemaker stable  Hyponatremia Resolved              DVT prophylaxis:SCDs Start: 05/11/21 2322     Code Status: Full Code  Family Communication: Wife at bedside  Patient status:Inpatient  Patient is from :Home  Anticipated discharge to:SNF  Estimated DC date: As soon as bed is available at SNF Consultants: Orthopedics  Procedures: ORIF  Antimicrobials:  Anti-infectives (From admission, onward)    Start     Dose/Rate Route Frequency Ordered Stop   05/13/21 0600  ceFAZolin (ANCEF) IVPB 2g/100 mL premix        2 g 200 mL/hr over 30 Minutes Intravenous On call to O.R. 05/12/21 1659 05/13/21 0604   05/12/21 2330  ceFAZolin (ANCEF) IVPB 2g/100 mL premix        2 g 200 mL/hr over 30 Minutes Intravenous Every 8 hours 05/12/21 2237 05/13/21 0557   05/12/21 1943  vancomycin (VANCOCIN) powder  Status:  Discontinued          As needed 05/12/21 1943 05/12/21  2107   05/12/21 1633  ceFAZolin (ANCEF) 2-4 GM/100ML-% IVPB       Note to Pharmacy: Shanda Bumps M: cabinet override      05/12/21 1633 05/13/21 0444       Subjective: Patient seen and examined at the bedside this morning.  Hemodynamically stable.  Sitting in the bed.  Wife at bedside.  Not in any kind of distress.   Comfortable Objective: Vitals:   05/19/21 1543 05/19/21 2026 05/20/21 0730 05/20/21 1137  BP: 134/82 104/65 137/78 132/68  Pulse: 92 80 78 77  Resp: 19 16 15  (!) 24  Temp: 98.1 F (36.7 C) 98 F (36.7 C) 98.3 F (36.8 C)   TempSrc: Oral Oral Oral   SpO2: 98% 100% 98% 96%  Weight:      Height:       No intake or output data in the 24 hours ending 05/20/21 1320  Filed Weights   05/11/21 2152  Weight: 90.7 kg    Examination:  General exam: Overall comfortable, not in distress HEENT: PERRL Respiratory system:  no wheezes or crackles  Cardiovascular system: S1 & S2 heard, RRR.  Gastrointestinal system: Abdomen is nondistended, soft and nontender. Central nervous system: Alert and oriented Extremities: No edema, no clubbing ,no cyanosis,surgical wound on the right thigh, short boot on the left leg Skin: No rashes, no ulcers,no icterus    Data Reviewed: I have personally reviewed following labs and imaging studies  CBC: Recent Labs  Lab 05/16/21 0151 05/17/21 0620 05/18/21 0247 05/19/21 0629 05/20/21 0142  WBC 8.9 8.7 12.4* 12.0* 12.3*  NEUTROABS 7.2 6.8 10.2* 9.7* 8.9*  HGB 8.2* 9.1* 9.6* 9.3* 9.2*  HCT 24.1* 26.2* 27.4* 27.8* 28.2*  MCV 88.9 87.3 85.9 88.0 90.4  PLT 183 249 288 356 383   Basic Metabolic Panel: Recent Labs  Lab 05/14/21 0159 05/15/21 0808 05/15/21 1019 05/16/21 0151 05/17/21 0620 05/18/21 0247 05/19/21 0631 05/20/21 0142  NA 131*   < >  --  134* 133* 127* 130* 137  K 4.2   < >  --  3.4* 3.7 3.3* 3.3* 3.8  CL 99   < >  --  102 101 97* 99 106  CO2 23   < >  --  23 24 21* 21* 21*  GLUCOSE 144*   < >  --  130* 132* 136* 134* 125*  BUN 18   < >  --  16 10 13  24* 25*  CREATININE 1.13   < >  --  0.85 0.75 0.74 0.93 0.91  CALCIUM 7.9*   < >  --  8.1* 8.5* 8.6* 8.5* 8.7*  MG 1.6*  --  1.8  --   --  1.8  --   --    < > = values in this interval not displayed.     Recent Results (from the past 240 hour(s))  Resp Panel by RT-PCR (Flu A&B,  Covid) Nasopharyngeal Swab     Status: None   Collection Time: 05/11/21  9:32 PM   Specimen: Nasopharyngeal Swab; Nasopharyngeal(NP) swabs in vial transport medium  Result Value Ref Range Status   SARS Coronavirus 2 by RT PCR NEGATIVE NEGATIVE Final    Comment: (NOTE) SARS-CoV-2 target nucleic acids are NOT DETECTED.  The SARS-CoV-2 RNA is generally detectable in upper respiratory specimens during the acute phase of infection. The lowest concentration of SARS-CoV-2 viral copies this assay can detect is 138 copies/mL. A negative result does not preclude SARS-Cov-2 infection and should  not be used as the sole basis for treatment or other patient management decisions. A negative result may occur with  improper specimen collection/handling, submission of specimen other than nasopharyngeal swab, presence of viral mutation(s) within the areas targeted by this assay, and inadequate number of viral copies(<138 copies/mL). A negative result must be combined with clinical observations, patient history, and epidemiological information. The expected result is Negative.  Fact Sheet for Patients:  BloggerCourse.comhttps://www.fda.gov/media/152166/download  Fact Sheet for Healthcare Providers:  SeriousBroker.ithttps://www.fda.gov/media/152162/download  This test is no t yet approved or cleared by the [place] FDA and  has been authorized for detection and/or diagnosis of SARS-CoV-2 by FDA under an Emergency Use Authorization (EUA). This EUA will remain  in effect (meaning this test can be used) for the duration of the COVID-19 declaration under Section 564(b)(1) of the Act, 21 U.S.C.section 360bbb-3(b)(1), unless the authorization is terminated  or revoked sooner.       Influenza A by PCR NEGATIVE NEGATIVE Final   Influenza B by PCR NEGATIVE NEGATIVE Final    Comment: (NOTE) The Xpert Xpress SARS-CoV-2/FLU/RSV plus assay is intended as an aid in the diagnosis of influenza from Nasopharyngeal swab specimens and should  not be used as a sole basis for treatment. Nasal washings and aspirates are unacceptable for Xpert Xpress SARS-CoV-2/FLU/RSV testing.  Fact Sheet for Patients: BloggerCourse.comhttps://www.fda.gov/media/152166/download  Fact Sheet for Healthcare Providers: SeriousBroker.ithttps://www.fda.gov/media/152162/download  This test is not yet approved or cleared by the [place] FDA and has been authorized for detection and/or diagnosis of SARS-CoV-2 by FDA under an Emergency Use Authorization (EUA). This EUA will remain in effect (meaning this test can be used) for the duration of the COVID-19 declaration under Section 564(b)(1) of the Act, 21 U.S.C. section 360bbb-3(b)(1), unless the authorization is terminated or revoked.  Performed at [facility], [address] [place], [place] [address]   Urine Culture     Status: Abnormal   Collection Time: 05/18/21  9:50 PM   Specimen: Urine, Clean Catch  Result Value Ref Range Status   Specimen Description URINE, CLEAN CATCH  Final   Special Requests NONE  Final   Culture (A)  Final    <10,000 COLONIES/mL INSIGNIFICANT GROWTH Performed at [facility], [address] [place], [place] [address]    Report Status 05/20/2021 FINAL  Final  Culture, blood (routine x 2)     Status: None (Preliminary result)   Collection Time: 05/19/21  6:29 AM   Specimen: BLOOD LEFT ARM  Result Value Ref Range Status   Specimen Description BLOOD LEFT ARM  Final   Special Requests   Final    BOTTLES DRAWN AEROBIC AND ANAEROBIC Blood Culture adequate volume   Culture   Final    NO GROWTH 1 DAY Performed at [facility], [address] [place], [place] [address]    Report Status PENDING  Incomplete  Culture, blood (routine x 2)     Status: None (Preliminary result)   Collection Time: 05/19/21  6:36 AM   Specimen: BLOOD LEFT FOREARM  Result Value Ref Range Status   Specimen Description BLOOD LEFT FOREARM  Final   Special Requests   Final    BOTTLES DRAWN AEROBIC AND  ANAEROBIC Blood Culture adequate volume   Culture   Final    NO GROWTH 1 DAY Performed at [facility], [address] [place], [place] [address]    Report Status PENDING  Incomplete     Radiology Studies:  CT ABDOMEN PELVIS W CONTRAST  Result Date: 05/18/2021 CLINICAL DATA:  Nausea, vomiting. EXAM: CT ABDOMEN AND PELVIS WITH CONTRAST TECHNIQUE: Multidetector CT imaging of the abdomen and pelvis was performed using the standard protocol following bolus administration of intravenous contrast. RADIATION DOSE REDUCTION: This exam was performed according to the departmental dose-optimization program which includes automated exposure control, adjustment of the mA and/or kV according to patient size and/or use of iterative reconstruction technique. CONTRAST:  OMNIPAQUE IOHEXOL 300 MG/ML  SOLN COMPARISON:  October 16, 2009. FINDINGS: Lower chest: No acute abnormality. Hepatobiliary: Mild cholelithiasis is noted. No biliary dilatation is noted. The liver is unremarkable. Pancreas: Unremarkable. No pancreatic ductal dilatation or surrounding inflammatory changes. Spleen: Normal in size without focal abnormality. Adrenals/Urinary Tract: Adrenal glands appear normal. Stable left renal cyst. No hydronephrosis or renal obstruction is noted. Urinary bladder is unremarkable. Stomach/Bowel: Stomach appears normal. There is no evidence of bowel obstruction or inflammation. Diverticulosis of descending and sigmoid colon is noted without inflammation. Vascular/Lymphatic: Aortic atherosclerosis. No enlarged abdominal or pelvic lymph nodes. Reproductive: Prostate is unremarkable. Other: No abdominal wall hernia or abnormality. No abdominopelvic ascites. Musculoskeletal: Status post bilateral hip arthroplasties. No acute osseous abnormality is noted. IMPRESSION: Cholelithiasis. Diverticulosis of descending and sigmoid colon without inflammation. No acute abnormality is seen. Aortic Atherosclerosis (ICD10-I70.0).  Electronically Signed   By: Lupita Raider M.D.   On: 05/18/2021 16:56   US Abdomen Limited RUQ (LIVER/GB)  Result Date: 05/18/2021 CLINICAL DATA:  Nausea and vomiting with known cholelithiasis EXAM: ULTRASOUND ABDOMEN LIMITED RIGHT UPPER QUADRANT COMPARISON:  CT from earlier in the same day. FINDINGS: Gallbladder: Predominately decompressed with multiple gallstones within. Negative sonographic Murphy's sign is elicited. No wall thickening or pericholecystic fluid is noted. Common bile duct: Diameter: 4.6 mm Liver: Mildly increased echogenicity is noted consistent with fatty infiltration. No other focal abnormality is noted. Portal vein is patent on color Doppler imaging with normal direction of blood flow towards the liver. Other: None. IMPRESSION: Cholelithiasis without complicating factors. Mild fatty infiltration of the liver. Electronically Signed   By: Alcide Clever M.D.   On: 05/18/2021 19:59    Scheduled Meds:  aspirin  300 mg Rectal Daily   Or   aspirin EC  325 mg Oral Daily   atorvastatin  40 mg Oral QHS   feeding supplement  237 mL Oral BID BM   finasteride  5 mg Oral QHS   flecainide  100 mg Oral BID   pantoprazole  40 mg Oral Daily   pregabalin  100 mg Oral BID   sertraline  100 mg Oral q morning   Continuous Infusions:   LOS: 9 days   Burnadette Pop, MD [facility] P3/11/2021, 1:20 PM

## 2021-05-20 NOTE — TOC Progression Note (Addendum)
Transition of Care (TOC) - Progression Note  ? ? ?Patient Details  ?Name: AIKEN ARAGONA ?MRN: [id number] ?Date of Birth: Jul 31, 1934 ? ?Transition of Care (TOC) CM/SW Contact  ?Joanne Chars, LCSW ?Phone Number: ?05/20/2021, 9:52 AM ? ?Clinical Narrative:   SNF auth approved in [employer]: Plan auth ID: [id number], [id number], 5 days: 3/9-3/13 ? ?[id number]: CSW received message from [facility] and they are rescinding their bed offer. ? ? ?Expected Discharge Plan: [place] ?Barriers to Discharge: Continued Medical Work up ? ?Expected Discharge Plan and Services ?Expected Discharge Plan: [place] ?  ?Discharge Planning Services: CM Consult ?Post Acute Care Choice: Durable Medical Equipment (hospital bed) ?Living arrangements for the past 2 months: [place] ?                ?  ?  ?  ?  ?  ?  ?[place] Agency: [facility] ([place]) ?Date HH Agency Contacted: 05/13/21 ?Time [place]: [id number] spoke with at [place]: Corene Cornea ? ? ?Social Determinants of Health (SDOH) Interventions ?  ? ?Readmission Risk Interventions ?No flowsheet data found. ? ?

## 2021-05-21 DIAGNOSIS — S7291XA Unspecified fracture of right femur, initial encounter for closed fracture: Secondary | ICD-10-CM | POA: Diagnosis not present

## 2021-05-21 NOTE — Progress Notes (Signed)
Pt already on CPAP and resting comfortably. RT will cont to monitor.  

## 2021-05-21 NOTE — Progress Notes (Signed)
Pt seen prior to eating breakfast, reported nausea, no lightheadedness, BP stable. Able to transfer to bed with stedy and +2 max assist. Continues to have difficulty maintaining NWB on R LE.  ? ? 05/21/21 1000  ?OT Visit Information  ?Last OT Received On 05/21/21  ?Assistance Needed +2  ?PT/OT/SLP Co-Evaluation/Treatment Yes  ?Reason for Co-Treatment For patient/therapist safety;Complexity of the patient's impairments (multi-system involvement)  ?OT goals addressed during session Strengthening/ROM  ?History of Present Illness 87 yo admitted 2/28 after fall at home with Rt femur fx s/p ORIF 3/1. PMhx: bil TKA, bil THA, HTN, cOPD, CVA  ?Precautions  ?Precautions Fall  ?Restrictions  ?Weight Bearing Restrictions Yes  ?RLE Weight Bearing NWB  ?Other Position/Activity Restrictions Pt now has a short boot on LLE but no reported WB changes for sprain ankle  ?Pain Assessment  ?Pain Assessment Faces  ?Faces Pain Scale 4  ?Pain Location R LE  ?Pain Descriptors / Indicators Grimacing;Guarding;Sore;Moaning  ?Pain Intervention(s) Monitored during session;Repositioned  ?Cognition  ?Arousal/Alertness Awake/alert  ?Behavior During Therapy [facility] for tasks assessed/performed  ?Overall Cognitive Status Impaired/Different from baseline  ?Area of Impairment Following commands;Memory;Attention;Awareness;Problem solving  ?Memory Decreased short-term memory  ?Following Commands Follows one step commands with increased time  ?Problem Solving Slow processing;Decreased initiation;Difficulty sequencing;Requires verbal cues;Requires tactile cues  ?General Comments cognition continues to improve  ?ADL  ?Overall ADL's  Needs assistance/impaired  ?Grooming Wash/dry face;Sitting;Set up  ?Lower Body Dressing Total assistance;Bed level  ?Bed Mobility  ?Overal bed mobility Needs Assistance  ?Bed Mobility Supine to Sit  ?Supine to sit Min assist  ?General bed mobility comments placed bed in chair position prior to sitting EOB, light min assist for R LE  over EOB  ?Transfers  ?Overall transfer level Needs assistance  ?Equipment used Rolling walker (2 wheels);Ambulation equipment used  ?Transfers Sit to/from Stand  ?Sit to Stand +2 physical assistance;Max assist;Total assist  ?General transfer comment max-total assist for pt to attempt standing with pt able to partially clear hips from EOB with RW. Max assist for pt to come to standing in stedy  ?Balance  ?Overall balance assessment Needs assistance  ?Sitting-balance support No upper extremity supported  ?Sitting balance-Leahy Scale Fair  ?OT - End of Session  ?Equipment Utilized During Treatment Gait belt;Rolling walker (2 wheels)  ?Activity Tolerance Patient tolerated treatment well  ?Patient left in chair;with call bell/phone within reach;with chair alarm set;with family/visitor present  ?Nurse Communication Mobility status;Need for lift equipment  ?OT Assessment/Plan  ?OT Plan Discharge plan remains appropriate  ?OT Visit Diagnosis Pain;Muscle weakness (generalized) (M62.81);Other symptoms and signs involving cognitive function  ?OT Frequency (ACUTE ONLY) Min 2X/week  ?Follow Up Recommendations Skilled nursing-short term rehab (<3 hours/day)  ?Assistance recommended at discharge Frequent or constant Supervision/Assistance  ?Patient can return home with the following Two people to help with walking and/or transfers;A lot of help with bathing/dressing/bathroom;Assistance with feeding;Assistance with cooking/housework;Direct supervision/assist for medications management;Direct supervision/assist for financial management;Assist for transportation;Help with stairs or ramp for entrance  ?OT Equipment Wheelchair (measurements OT);Wheelchair cushion (measurements OT);Hospital bed  ?AM-PAC OT "6 Clicks" Daily Activity Outcome Measure (Version 2)  ?Help from another person eating meals? 3  ?Help from another person taking care of personal grooming? 3  ?Help from another person toileting, which includes using toliet,  bedpan, or urinal? 1  ?Help from another person bathing (including washing, rinsing, drying)? 2  ?Help from another person to put on and taking off regular upper body clothing? 3  ?Help from another person to  put on and taking off regular lower body clothing? 1  ?6 Click Score 13  ?Progressive Mobility  ?What is the highest level of mobility based on the progressive mobility assessment? Level 3 (Stands with assist) - Balance while standing  and cannot march in place  ?Activity Transferred from bed to chair  ?OT Goal Progression  ?Progress towards OT goals Progressing toward goals  ?Acute Rehab OT Goals  ?OT Goal Formulation With patient/family  ?Time For Goal Achievement 05/28/21  ?Potential to Achieve Goals Good  ?OT Time Calculation  ?OT Start Time (ACUTE ONLY) 0845  ?OT Stop Time (ACUTE ONLY) 0919  ?OT Time Calculation (min) 34 min  ?OT General Charges  ?$OT Visit 1 Visit  ?OT Treatments  ?$Therapeutic Activity 8-22 mins  ?Nestor Lewandowsky, OTR/L ?Acute Rehabilitation Services ?Pager: [phone] ?Office: [phone]  ?

## 2021-05-21 NOTE — Discharge Summary (Addendum)
Physician Discharge Summary  Marcus Mcgee ZOX:[id number] DOB: [birth year] DOA: 05/11/2021  PCP: Selina Cooley, MD  Admit date: 05/11/2021 Discharge date: 05/24/2021  Admitted From: Home Disposition:  SNF  Discharge Condition:Stable CODE STATUS:FULL Diet recommendation: Heart Healthy   Brief/Interim Summary:  Patient is 87 y.o. male past medical history significant for HTN, pacemaker with history arrhythmia, history of CVA who presents by EMS after a fall after this he noticed deformity of his right thigh EMS was called according to his wife he has been having difficulty with balance and has had couple of falls with no injuries. .  Family relates he has been falling for the last several months.  He was found to have fracture of the right femur.  Orthopedic surgery was consulted, had ORIF on 05/12/21.  Hospital course remarkable for acute blood loss anemia, confusion.  Mental status has improved.  Hospital course remarkable for slow improvement in the mobility. PT/OT recommending SNF on  discharge.  Medically stable for discharge.  Following problems were addressed during her hospitalization:   Closed fracture of right femur, unspecified fracture morphology, initial encounter [facility]) Orthopedic surgery consulted, status post ORIF of the distal femur on 05/12/2021 Continue ASA 325 mg as DVT ppx PT/OT- SNF Patient also sprained his left ankle, currently on a short boot   Acute postoperative anemia due to expected blood loss Postop surgical hemoglobin 5.9, transfused 2 units of PRBC--> 7-->6.8 transfused 1U of PRBC on 05/15/21 Currently hemoglobin stable in the range of 9.  Monitor CBC   Acute metabolic encephalopathy Likely due to Lyrica, trazodone, narcotics, severe anemia plus or minus anesthetic during the surgery, underlying ?dementia, hospital delirium Currently alert and oriented   History of CVA (cerebrovascular accident) CTA head and neck with no large vessel occlusion, unchanged severe  stenosis of the right PCA P1 segment and unchanged also on the left 2D echo EF of 65% with grade 1 diastolic heart failure no wall motion abnormality.   Prolonged QT interval Avoid medications which could further prolong QT interval.On flecainide   COPD (chronic obstructive pulmonary disease) (HCC) Currently stable, continue bronchodilators as needed.   Essential hypertension Monitor BP.  Continue current medications   Pacemaker stable   Hyponatremia Resolved    Discharge Diagnoses:  Principal Problem:   Closed fracture of right femur, unspecified fracture morphology, initial encounter (HCC) Active Problems:   Hyponatremia   Pacemaker   Essential hypertension   COPD (chronic obstructive pulmonary disease) (HCC)   Prolonged QT interval   History of CVA (cerebrovascular accident)   Acute metabolic encephalopathy   Acute postoperative anemia due to expected blood loss    Discharge Instructions  Discharge Instructions     Diet - low sodium heart healthy   Complete by: As directed    Discharge instructions   Complete by: As directed    1)Please take your medications as instructed 2)Follow up with Orthopedics in 2 weeks.Name and number of the provider has been attached 3)Do a CBC test in a week   Increase activity slowly   Complete by: As directed    No wound care   Complete by: As directed       Allergies as of 05/24/2021   No Known Allergies      Medication List     TAKE these medications    acetaminophen 500 MG tablet Commonly known as: TYLENOL Take 1,000 mg by mouth daily as needed for headache.   aspirin EC 325 MG tablet Take 325 mg by  mouth every morning.   atorvastatin 20 MG tablet Commonly known as: LIPITOR Take 40 mg by mouth at bedtime.   docusate sodium 100 MG capsule Commonly known as: COLACE Take 100 mg by mouth every morning.   finasteride 5 MG tablet Commonly known as: PROSCAR Take 5 mg by mouth at bedtime.   flecainide 100 MG  tablet Commonly known as: TAMBOCOR Take 100 mg by mouth 2 (two) times daily.   fludrocortisone 0.1 MG tablet Commonly known as: FLORINEF Take 0.4 mcg by mouth daily as needed (SBP <80). Take 30 meq potassium with each dose   fluocinonide 0.05 % external solution Commonly known as: LIDEX Apply 1 application topically 2 (two) times daily as needed (scalp itching).   fluticasone 50 MCG/ACT nasal spray Commonly known as: FLONASE Place 2 sprays into both nostrils daily as needed for allergies or rhinitis (congestion).   hydrocortisone 2.5 % cream Apply 1 application topically 2 (two) times daily as needed (itching).   omeprazole 20 MG capsule Commonly known as: PRILOSEC Take 20 mg by mouth at bedtime.   oxyCODONE-acetaminophen 5-325 MG tablet Commonly known as: PERCOCET/ROXICET Take 1 tablet by mouth 4 (four) times daily as needed (pain).   potassium chloride 10 MEQ CR capsule Commonly known as: MICRO-K Take 30 mEq by mouth daily as needed (with each dose of fludrocortisone).   pregabalin 200 MG capsule Commonly known as: LYRICA Take 200 mg by mouth 2 (two) times daily.   sertraline 100 MG tablet Commonly known as: ZOLOFT Take 100 mg by mouth every morning.   traZODone 100 MG tablet Commonly known as: DESYREL Take 200 mg by mouth at bedtime.   VISINE OP Place 1 drop into both eyes daily as needed (dry eyes).   vitamin B-12 1000 MCG tablet Commonly known as: CYANOCOBALAMIN Take 1,000 mcg by mouth every morning.   Vitamin D3 50 MCG (2000 UT) Tabs Take 2,000 Units by mouth every morning.        Follow-up Information     Huel Cote, MD Follow up.   Specialty: Orthopedic Surgery Contact information: [address] [place] [address] [phone]                No Known Allergies  Consultations: Orthopedics   Procedures/Studies: CT ANGIO HEAD NECK W WO CM  Result Date: 05/12/2021 CLINICAL DATA:  Transient ischemic attack  EXAM: CT ANGIOGRAPHY HEAD AND NECK TECHNIQUE: Multidetector CT imaging of the head and neck was performed using the standard protocol during bolus administration of intravenous contrast. Multiplanar CT image reconstructions and MIPs were obtained to evaluate the vascular anatomy. Carotid stenosis measurements (when applicable) are obtained utilizing NASCET criteria, using the distal internal carotid diameter as the denominator. RADIATION DOSE REDUCTION: This exam was performed according to the departmental dose-optimization program which includes automated exposure control, adjustment of the mA and/or kV according to patient size and/or use of iterative reconstruction technique. CONTRAST:  75mL OMNIPAQUE IOHEXOL 350 MG/ML SOLN COMPARISON:  03/21/2021 head CT 10/22/2019 CTA head neck FINDINGS: CT HEAD FINDINGS Brain: There is no mass, hemorrhage or extra-axial collection. There is generalized atrophy without lobar predilection. Old left basal ganglia small vessel infarct. There is hypoattenuation of the periventricular white matter, most commonly indicating chronic ischemic microangiopathy. Skull: The visualized skull base, calvarium and extracranial soft tissues are normal. Sinuses/Orbits: No fluid levels or advanced mucosal thickening of the visualized paranasal sinuses. No mastoid or middle ear effusion. The orbits are normal. CTA NECK FINDINGS SKELETON: There  is no bony spinal canal stenosis. No lytic or blastic lesion. OTHER NECK: Normal pharynx, larynx and major salivary glands. No cervical lymphadenopathy. Unremarkable thyroid gland. UPPER CHEST: No pneumothorax or pleural effusion. No nodules or masses. AORTIC ARCH: There is calcific atherosclerosis of the aortic arch. There is no aneurysm, dissection or hemodynamically significant stenosis of the visualized portion of the aorta. Normal variant aortic arch branching pattern with the brachiocephalic and left common carotid arteries sharing a common origin. The  visualized proximal subclavian arteries are widely patent. RIGHT CAROTID SYSTEM: No dissection, occlusion or aneurysm. Mild atherosclerotic calcification at the carotid bifurcation without hemodynamically significant stenosis. LEFT CAROTID SYSTEM: No dissection, occlusion or aneurysm. Mild atherosclerotic calcification at the carotid bifurcation without hemodynamically significant stenosis. VERTEBRAL ARTERIES: Left dominant configuration. Both origins are clearly patent. There is no dissection, occlusion or flow-limiting stenosis to the skull base (V1-V3 segments). CTA HEAD FINDINGS POSTERIOR CIRCULATION: --Vertebral arteries: Normal V4 segments. --Inferior cerebellar arteries: Normal. --Basilar artery: Normal. --Superior cerebellar arteries: Normal. --Posterior cerebral arteries (PCA): Severe stenosis of the right P1 segment. There is occlusion of the left P1 segment, but the P2 segment is patent via supply from the left P-comm. ANTERIOR CIRCULATION: --Intracranial internal carotid arteries: Normal. --Anterior cerebral arteries (ACA): Normal. Both A1 segments are present. Patent anterior communicating artery (a-comm). --Middle cerebral arteries (MCA): Normal. VENOUS SINUSES: As permitted by contrast timing, patent. ANATOMIC VARIANTS: None Review of the MIP images confirms the above findings. IMPRESSION: 1. No emergent large vessel occlusion. 2. Unchanged severe stenosis of the right PCA P1 segment. 3. Unchanged occlusion of the left PCA P1 segment, but the P2 segment is patent via supply from the left P-comm. Aortic Atherosclerosis (ICD10-I70.0). Electronically Signed   By: Deatra Robinson M.D.   On: 05/12/2021 00:07   DG Ankle Complete Left  Result Date: 05/11/2021 CLINICAL DATA:  Level 2 trauma.  Fall. EXAM: LEFT ANKLE COMPLETE - 3+ VIEW COMPARISON:  None. FINDINGS: Large calcaneal heel spur. Minimal chronic enthesopathic change at the Achilles insertion on the calcaneus. Mild medial tibiotalar joint space  narrowing. Moderate talonavicular joint space narrowing. No acute fracture or dislocation. Mild-to-moderate lateral malleolar soft tissue swelling. IMPRESSION:: IMPRESSION: 1. Mild-to-moderate lateral malleolar soft tissue swelling. No acute fracture is seen. 2. Large calcaneal heel spur. Electronically Signed   By: Neita Garnet M.D.   On: 05/11/2021 21:59   CT ABDOMEN PELVIS W CONTRAST  Result Date: 05/18/2021 CLINICAL DATA:  Nausea, vomiting. EXAM: CT ABDOMEN AND PELVIS WITH CONTRAST TECHNIQUE: Multidetector CT imaging of the abdomen and pelvis was performed using the standard protocol following bolus administration of intravenous contrast. RADIATION DOSE REDUCTION: This exam was performed according to the departmental dose-optimization program which includes automated exposure control, adjustment of the mA and/or kV according to patient size and/or use of iterative reconstruction technique. CONTRAST:  OMNIPAQUE IOHEXOL 300 MG/ML  SOLN COMPARISON:  October 16, 2009. FINDINGS: Lower chest: No acute abnormality. Hepatobiliary: Mild cholelithiasis is noted. No biliary dilatation is noted. The liver is unremarkable. Pancreas: Unremarkable. No pancreatic ductal dilatation or surrounding inflammatory changes. Spleen: Normal in size without focal abnormality. Adrenals/Urinary Tract: Adrenal glands appear normal. Stable left renal cyst. No hydronephrosis or renal obstruction is noted. Urinary bladder is unremarkable. Stomach/Bowel: Stomach appears normal. There is no evidence of bowel obstruction or inflammation. Diverticulosis of descending and sigmoid colon is noted without inflammation. Vascular/Lymphatic: Aortic atherosclerosis. No enlarged abdominal or pelvic lymph nodes. Reproductive: Prostate is unremarkable. Other: No abdominal wall hernia or abnormality.  No abdominopelvic ascites. Musculoskeletal: Status post bilateral hip arthroplasties. No acute osseous abnormality is noted. IMPRESSION: Cholelithiasis.  Diverticulosis of descending and sigmoid colon without inflammation. No acute abnormality is seen. Aortic Atherosclerosis (ICD10-I70.0). Electronically Signed   By: Lupita Raider M.D.   On: 05/18/2021 16:56   DG Pelvis Portable  Result Date: 05/11/2021 CLINICAL DATA:  Larey Seat EXAM: PORTABLE PELVIS 1-2 VIEWS COMPARISON:  None. FINDINGS: Single frontal view of the pelvis excludes portions of the left iliac crest and proximal right femur by collimation. Bilateral hip arthroplasties are in the expected position without signs of acute complication. No acute displaced fractures. Soft tissues are unremarkable. IMPRESSION: 1. Unremarkable pelvis and bilateral hips. Electronically Signed   By: Sharlet Salina M.D.   On: 05/11/2021 22:09   DG Chest Port 1 View  Result Date: 05/18/2021 CLINICAL DATA:  Leukocytosis. Postop from internal fixation of femur fracture. EXAM: PORTABLE CHEST 1 VIEW COMPARISON:  05/11/2021 FINDINGS: The heart size and mediastinal contours are within normal limits. Permanent pacemaker remains in appropriate position. Aortic atherosclerotic calcification noted. Both lungs are clear. Postop or posttraumatic changes are again seen involving the right 4th rib. IMPRESSION: No active disease. Electronically Signed   By: Danae Orleans M.D.   On: 05/18/2021 08:34   DG Chest Port 1 View  Result Date: 05/11/2021 CLINICAL DATA:  Level 2 trauma, fell EXAM: PORTABLE CHEST 1 VIEW COMPARISON:  03/20/2013 FINDINGS: Single frontal view of the chest demonstrates stable dual lead pacer. The cardiac silhouette is unremarkable. No acute airspace disease, effusion, or pneumothorax. No acute displaced fractures. Stable posttraumatic or postsurgical changes of the right fourth rib. IMPRESSION: 1. No acute intrathoracic process. Electronically Signed   By: Sharlet Salina M.D.   On: 05/11/2021 22:06   DG Abd Portable 1V  Result Date: 05/17/2021 CLINICAL DATA:  Nausea and vomiting EXAM: PORTABLE ABDOMEN - 1 VIEW  COMPARISON:  None. FINDINGS: Bowel gas pattern is nonspecific. Gas is present in colon. There is small amount of stool in colon without signs of fecal impaction. No abnormal masses or calcifications are seen. Kidneys are mostly obscured by bowel contents. There is arthroplasty in both hips. Degenerative changes are noted in lumbar spine. IMPRESSION: Nonspecific bowel gas pattern. Electronically Signed   By: Ernie Avena M.D.   On: 05/17/2021 16:04   DG C-Arm 1-60 Min-No Report  Result Date: 05/12/2021 Fluoroscopy was utilized by the requesting physician.  No radiographic interpretation.   DG C-Arm 1-60 Min-No Report  Result Date: 05/12/2021 Fluoroscopy was utilized by the requesting physician.  No radiographic interpretation.   DG C-Arm 1-60 Min-No Report  Result Date: 05/12/2021 Fluoroscopy was utilized by the requesting physician.  No radiographic interpretation.   ECHOCARDIOGRAM COMPLETE  Result Date: 05/12/2021    ECHOCARDIOGRAM REPORT   Patient Name:   CEDERIC MOZLEY Date of Exam: 05/12/2021 Medical Rec #:  [id number]    Height:       75.0 in Accession #:    [id number]   Weight:       200.0 lb Date of Birth:  01/24/1935   BSA:          2.194 m Patient Age:    86 years     BP:           121/85 mmHg Patient Gender: M            HR:           77 bpm. Exam Location:  Inpatient Procedure: 2D Echo  Indications:    TIA  History:        Patient has no prior history of Echocardiogram examinations.                 COPD and Stroke; Risk Factors:Hypertension.  Sonographer:    Devonne Doughty Referring Phys: [id number] Carlton Adam  Sonographer Comments: Technically difficult study due to poor echo windows. Image acquisition challenging due to patient body habitus, Image acquisition challenging due to respiratory motion and supine. IMPRESSIONS  1. Left ventricular ejection fraction, by estimation, is 65 to 70%. The left ventricle has normal function. The left ventricle has no regional wall motion  abnormalities. There is mild left ventricular hypertrophy. Left ventricular diastolic parameters are consistent with Grade I diastolic dysfunction (impaired relaxation).  2. Right ventricular systolic function is normal. The right ventricular size is normal. There is normal pulmonary artery systolic pressure.  3. The mitral valve is normal in structure. Trivial mitral valve regurgitation.  4. The aortic valve is normal in structure. Aortic valve regurgitation is not visualized. FINDINGS  Left Ventricle: Left ventricular ejection fraction, by estimation, is 65 to 70%. The left ventricle has normal function. The left ventricle has no regional wall motion abnormalities. The left ventricular internal cavity size was normal in size. There is  mild left ventricular hypertrophy. Left ventricular diastolic parameters are consistent with Grade I diastolic dysfunction (impaired relaxation). Right Ventricle: The right ventricular size is normal. Right vetricular wall thickness was not well visualized. Right ventricular systolic function is normal. There is normal pulmonary artery systolic pressure. The tricuspid regurgitant velocity is 2.47 m/s, and with an assumed right atrial pressure of 8 mmHg, the estimated right ventricular systolic pressure is 32.4 mmHg. Left Atrium: Left atrial size was normal in size. Right Atrium: Right atrial size was normal in size. Pericardium: There is no evidence of pericardial effusion. Mitral Valve: The mitral valve is normal in structure. Trivial mitral valve regurgitation. Tricuspid Valve: The tricuspid valve is normal in structure. Tricuspid valve regurgitation is trivial. Aortic Valve: The aortic valve is normal in structure. Aortic valve regurgitation is not visualized. Aortic valve mean gradient measures 2.0 mmHg. Aortic valve peak gradient measures 4.1 mmHg. Aortic valve area, by VTI measures 2.40 cm. Pulmonic Valve: The pulmonic valve was normal in structure. Pulmonic valve  regurgitation is not visualized. Aorta: The aortic root and ascending aorta are structurally normal, with no evidence of dilitation. IAS/Shunts: The atrial septum is grossly normal.  LEFT VENTRICLE PLAX 2D LVIDd:         3.70 cm   Diastology LVIDs:         2.40 cm   LV e' medial:    5.33 cm/s LV PW:         1.10 cm   LV E/e' medial:  12.1 LV IVS:        1.10 cm   LV e' lateral:   8.38 cm/s LVOT diam:     2.00 cm   LV E/e' lateral: 7.7 LV SV:         48 LV SV Index:   22 LVOT Area:     3.14 cm  RIGHT VENTRICLE RV S prime:     11.90 cm/s TAPSE (M-mode): 1.7 cm LEFT ATRIUM             Index LA diam:        3.40 cm 1.55 cm/m LA Vol (A2C):   40.3 ml 18.37 ml/m LA Vol (A4C):   56.2  ml 25.61 ml/m LA Biplane Vol: 47.9 ml 21.83 ml/m  AORTIC VALVE AV Area (Vmax):    2.58 cm AV Area (Vmean):   2.56 cm AV Area (VTI):     2.40 cm AV Vmax:           101.00 cm/s AV Vmean:          67.900 cm/s AV VTI:            0.200 m AV Peak Grad:      4.1 mmHg AV Mean Grad:      2.0 mmHg LVOT Vmax:         82.90 cm/s LVOT Vmean:        55.300 cm/s LVOT VTI:          0.153 m LVOT/AV VTI ratio: 0.76  AORTA Ao Root diam: 3.70 cm Ao Asc diam:  3.70 cm MITRAL VALVE               TRICUSPID VALVE MV Area (PHT): 1.98 cm    TR Peak grad:   24.4 mmHg MV Decel Time: 384 msec    TR Vmax:        247.00 cm/s MV E velocity: 64.50 cm/s MV A velocity: 98.50 cm/s  SHUNTS MV E/A ratio:  0.65        Systemic VTI:  0.15 m                            Systemic Diam: 2.00 cm Kristeen Miss MD Electronically signed by Kristeen Miss MD Signature Date/Time: 05/12/2021/11:44:18 AM    Final    DG FEMUR PORT, 1V RIGHT  Result Date: 05/11/2021 CLINICAL DATA:  Larey Seat EXAM: RIGHT FEMUR PORTABLE 1 VIEW COMPARISON:  None. FINDINGS: Two frontal views of the right femur are obtained. There is a comminuted displaced fracture involving the mid femoral diaphysis. The fracture extends along the posterior margin of the distal femur, to the level of the femoral component of the  right knee arthroplasty. There is external rotation of the distal fracture fragments. Orthopedic hardware from the right hip and right knee arthroplasties appear unremarkable. IMPRESSION: 1. Comminuted periprosthetic fracture of the mid and distal right femur as above. External rotation of the distal fracture fragments. Electronically Signed   By: Sharlet Salina M.D.   On: 05/11/2021 22:09   DG FEMUR, MIN 2 VIEWS RIGHT  Result Date: 05/12/2021 CLINICAL DATA:  Fluoroscopic assistance for internal fixation of fracture of right femur EXAM: RIGHT FEMUR 2 VIEWS COMPARISON:  Study done on 05/11/2021 FINDINGS: There is interval reduction of comminuted displaced fracture in the mid and distal shaft of right femur with metallic sideplate and multiple surgical screws. There is marked improvement in alignment of fracture fragments. There is previous right hip and right knee arthroplasty. Fluoroscopic time was 4 minutes and 57 seconds. Radiation dose is 43.64 mGy. IMPRESSION: Fluoroscopic assistance was provided for reduction and internal fixation of severely comminuted fracture of mid and distal shaft of right femur. Electronically Signed   By: Ernie Avena M.D.   On: 05/12/2021 20:58   DG FEMUR PORT MIN 2 VIEWS LEFT  Result Date: 05/12/2021 CLINICAL DATA:  Encounter for post surgery. Per history given, likely incorrect side obtained as the right side radiographs demonstrate recent postsurgical changes. EXAM: LEFT FEMUR PORTABLE 2 VIEWS COMPARISON:  None. FINDINGS: Total left hip arthroplasty. Total left knee arthroplasty. There is no evidence of fracture or other focal bone lesions.  Soft tissues are unremarkable. IMPRESSION: 1. Total LEFT hip arthroplasty. 2. Total LEFT knee arthroplasty. 3.  No acute displaced fracture or dislocation. Electronically Signed   By: Tish Frederickson M.D.   On: 05/12/2021 22:19   DG FEMUR PORT, MIN 2 VIEWS RIGHT  Result Date: 05/12/2021 CLINICAL DATA:  Right femoral fracture repair  EXAM: RIGHT FEMUR PORTABLE 2 VIEW COMPARISON:  05/11/2021 FINDINGS: Frontal and lateral views of the right femur are obtained. Lateral plate and screw fixation spans the comminuted distal right femoral fracture seen previously. Alignment is near anatomic. Unremarkable right hip and right knee arthroplasty. Postsurgical changes are seen in the soft tissues. IMPRESSION: 1. ORIF comminuted distal right femoral fracture, with near anatomic alignment. Electronically Signed   By: Sharlet Salina M.D.   On: 05/12/2021 22:24   US Abdomen Limited RUQ (LIVER/GB)  Result Date: 05/18/2021 CLINICAL DATA:  Nausea and vomiting with known cholelithiasis EXAM: ULTRASOUND ABDOMEN LIMITED RIGHT UPPER QUADRANT COMPARISON:  CT from earlier in the same day. FINDINGS: Gallbladder: Predominately decompressed with multiple gallstones within. Negative sonographic Murphy's sign is elicited. No wall thickening or pericholecystic fluid is noted. Common bile duct: Diameter: 4.6 mm Liver: Mildly increased echogenicity is noted consistent with fatty infiltration. No other focal abnormality is noted. Portal vein is patent on color Doppler imaging with normal direction of blood flow towards the liver. Other: None. IMPRESSION: Cholelithiasis without complicating factors. Mild fatty infiltration of the liver. Electronically Signed   By: Alcide Clever M.D.   On: 05/18/2021 19:59      Subjective: Patient seen and examined at the bedside this morning.  Hemodynamically stable for discharge today.  Discharge Exam: Vitals:   05/23/21 1900 05/24/21 0747  BP: 118/65 114/72  Pulse: 86 73  Resp: 12 17  Temp: 98.3 F (36.8 C) 98.2 F (36.8 C)  SpO2: 99% 97%   Vitals:   05/23/21 1021 05/23/21 1036 05/23/21 1900 05/24/21 0747  BP:   118/65 114/72  Pulse: 84 75 86 73  Resp: 15 14 12 17   Temp:   98.3 F (36.8 C) 98.2 F (36.8 C)  TempSrc:   Oral Oral  SpO2: 97% 98% 99% 97%  Weight:      Height:        General: Pt is alert, awake, not  in acute distress Cardiovascular: RRR, S1/S2 +, no rubs, no gallops Respiratory: CTA bilaterally, no wheezing, no rhonchi Abdominal: Soft, NT, ND, bowel sounds + Extremities: no edema, no cyanosis    The results of significant diagnostics from this hospitalization (including imaging, microbiology, ancillary and laboratory) are listed below for reference.     Microbiology: Recent Results (from the past 240 hour(s))  Urine Culture     Status: Abnormal   Collection Time: 05/18/21  9:50 PM   Specimen: Urine, Clean Catch  Result Value Ref Range Status   Specimen Description URINE, CLEAN CATCH  Final   Special Requests NONE  Final   Culture (A)  Final    <10,000 COLONIES/mL INSIGNIFICANT GROWTH Performed at [facility], [address], [place], [place] [address]    Report Status 05/20/2021 FINAL  Final  Culture, blood (routine x 2)     Status: None   Collection Time: 05/19/21  6:29 AM   Specimen: BLOOD LEFT ARM  Result Value Ref Range Status   Specimen Description BLOOD LEFT ARM  Final   Special Requests   Final    BOTTLES DRAWN AEROBIC AND ANAEROBIC Blood Culture adequate volume  Culture   Final    NO GROWTH 5 DAYS Performed at [facility], [address] [place], [place] [address]    Report Status 05/24/2021 FINAL  Final  Culture, blood (routine x 2)     Status: None   Collection Time: 05/19/21  6:36 AM   Specimen: BLOOD LEFT FOREARM  Result Value Ref Range Status   Specimen Description BLOOD LEFT FOREARM  Final   Special Requests   Final    BOTTLES DRAWN AEROBIC AND ANAEROBIC Blood Culture adequate volume   Culture   Final    NO GROWTH 5 DAYS Performed at [facility], [address] [place], [place] [address]    Report Status 05/24/2021 FINAL  Final  Resp Panel by RT-PCR (Flu A&B, Covid) Nasopharyngeal Swab     Status: None   Collection Time: 05/20/21 12:38 PM   Specimen: Nasopharyngeal Swab; Nasopharyngeal(NP) swabs in vial transport medium   Result Value Ref Range Status   SARS Coronavirus 2 by RT PCR NEGATIVE NEGATIVE Final    Comment: (NOTE) SARS-CoV-2 target nucleic acids are NOT DETECTED.  The SARS-CoV-2 RNA is generally detectable in upper respiratory specimens during the acute phase of infection. The lowest concentration of SARS-CoV-2 viral copies this assay can detect is 138 copies/mL. A negative result does not preclude SARS-Cov-2 infection and should not be used as the sole basis for treatment or other patient management decisions. A negative result may occur with  improper specimen collection/handling, submission of specimen other than nasopharyngeal swab, presence of viral mutation(s) within the areas targeted by this assay, and inadequate number of viral copies(<138 copies/mL). A negative result must be combined with clinical observations, patient history, and epidemiological information. The expected result is Negative.  Fact Sheet for Patients:  BloggerCourse.comhttps://www.fda.gov/media/152166/download  Fact Sheet for Healthcare Providers:  SeriousBroker.ithttps://www.fda.gov/media/152162/download  This test is no t yet approved or cleared by the [place] FDA and  has been authorized for detection and/or diagnosis of SARS-CoV-2 by FDA under an Emergency Use Authorization (EUA). This EUA will remain  in effect (meaning this test can be used) for the duration of the COVID-19 declaration under Section 564(b)(1) of the Act, 21 U.S.C.section 360bbb-3(b)(1), unless the authorization is terminated  or revoked sooner.       Influenza A by PCR NEGATIVE NEGATIVE Final   Influenza B by PCR NEGATIVE NEGATIVE Final    Comment: (NOTE) The Xpert Xpress SARS-CoV-2/FLU/RSV plus assay is intended as an aid in the diagnosis of influenza from Nasopharyngeal swab specimens and should not be used as a sole basis for treatment. Nasal washings and aspirates are unacceptable for Xpert Xpress SARS-CoV-2/FLU/RSV testing.  Fact Sheet for  Patients: BloggerCourse.comhttps://www.fda.gov/media/152166/download  Fact Sheet for Healthcare Providers: SeriousBroker.ithttps://www.fda.gov/media/152162/download  This test is not yet approved or cleared by the [place] FDA and has been authorized for detection and/or diagnosis of SARS-CoV-2 by FDA under an Emergency Use Authorization (EUA). This EUA will remain in effect (meaning this test can be used) for the duration of the COVID-19 declaration under Section 564(b)(1) of the Act, 21 U.S.C. section 360bbb-3(b)(1), unless the authorization is terminated or revoked.  Performed at [facility], [address] [place], [place] [address]      Labs: BNP (last 3 results) No results for input(s): BNP in the last 8760 hours. Basic Metabolic Panel: Recent Labs  Lab 05/18/21 0247 05/19/21 0631 05/20/21 0142  NA 127* 130* 137  K 3.3* 3.3* 3.8  CL 97* 99  106  CO2 21* 21* 21*  GLUCOSE 136* 134* 125*  BUN 13 24* 25*  CREATININE 0.74 0.93 0.91  CALCIUM 8.6* 8.5* 8.7*  MG 1.8  --   --    Liver Function Tests: Recent Labs  Lab 05/19/21 0631  AST 32  ALT 30  ALKPHOS 67  BILITOT 1.0  PROT 6.0*  ALBUMIN 2.8*   No results for input(s): LIPASE, AMYLASE in the last 168 hours. No results for input(s): AMMONIA in the last 168 hours. CBC: Recent Labs  Lab 05/18/21 0247 05/19/21 0629 05/20/21 0142  WBC 12.4* 12.0* 12.3*  NEUTROABS 10.2* 9.7* 8.9*  HGB 9.6* 9.3* 9.2*  HCT 27.4* 27.8* 28.2*  MCV 85.9 88.0 90.4  PLT 288 356 383   Cardiac Enzymes: No results for input(s): CKTOTAL, CKMB, CKMBINDEX, TROPONINI in the last 168 hours. BNP: Invalid input(s): POCBNP CBG: No results for input(s): GLUCAP in the last 168 hours. D-Dimer No results for input(s): DDIMER in the last 72 hours. Hgb A1c No results for input(s): HGBA1C in the last 72 hours. Lipid Profile No results for input(s): CHOL, HDL, LDLCALC, TRIG, CHOLHDL, LDLDIRECT in the last 72 hours. Thyroid function studies No results for  input(s): TSH, T4TOTAL, T3FREE, THYROIDAB in the last 72 hours.  Invalid input(s): FREET3 Anemia work up No results for input(s): VITAMINB12, FOLATE, FERRITIN, TIBC, IRON, RETICCTPCT in the last 72 hours. Urinalysis    Component Value Date/Time   COLORURINE YELLOW 05/18/2021 0809   APPEARANCEUR CLEAR 05/18/2021 0809   LABSPEC 1.010 05/18/2021 0809   PHURINE 7.0 05/18/2021 0809   GLUCOSEU NEGATIVE 05/18/2021 0809   HGBUR NEGATIVE 05/18/2021 0809   BILIRUBINUR NEGATIVE 05/18/2021 0809   KETONESUR NEGATIVE 05/18/2021 0809   PROTEINUR 30 (A) 05/18/2021 0809   UROBILINOGEN 1.0 05/21/2007 1226   NITRITE NEGATIVE 05/18/2021 0809   LEUKOCYTESUR NEGATIVE 05/18/2021 0809   Sepsis Labs Invalid input(s): PROCALCITONIN,  WBC,  LACTICIDVEN Microbiology Recent Results (from the past 240 hour(s))  Urine Culture     Status: Abnormal   Collection Time: 05/18/21  9:50 PM   Specimen: Urine, Clean Catch  Result Value Ref Range Status   Specimen Description URINE, CLEAN CATCH  Final   Special Requests NONE  Final   Culture (A)  Final    <10,000 COLONIES/mL INSIGNIFICANT GROWTH Performed at [facility], [address], [place], [place] [address]    Report Status 05/20/2021 FINAL  Final  Culture, blood (routine x 2)     Status: None   Collection Time: 05/19/21  6:29 AM   Specimen: BLOOD LEFT ARM  Result Value Ref Range Status   Specimen Description BLOOD LEFT ARM  Final   Special Requests   Final    BOTTLES DRAWN AEROBIC AND ANAEROBIC Blood Culture adequate volume   Culture   Final    NO GROWTH 5 DAYS Performed at [facility], [address], [place], [place] [address]    Report Status 05/24/2021 FINAL  Final  Culture, blood (routine x 2)     Status: None   Collection Time: 05/19/21  6:36 AM   Specimen: BLOOD LEFT FOREARM  Result Value Ref Range Status   Specimen Description BLOOD LEFT FOREARM  Final   Special Requests   Final    BOTTLES DRAWN AEROBIC AND ANAEROBIC Blood  Culture adequate volume   Culture   Final    NO GROWTH 5 DAYS Performed at [facility], [address] [place], [place] [address]  Report Status 05/24/2021 FINAL  Final  Resp Panel by RT-PCR (Flu A&B, Covid) Nasopharyngeal Swab     Status: None   Collection Time: 05/20/21 12:38 PM   Specimen: Nasopharyngeal Swab; Nasopharyngeal(NP) swabs in vial transport medium  Result Value Ref Range Status   SARS Coronavirus 2 by RT PCR NEGATIVE NEGATIVE Final    Comment: (NOTE) SARS-CoV-2 target nucleic acids are NOT DETECTED.  The SARS-CoV-2 RNA is generally detectable in upper respiratory specimens during the acute phase of infection. The lowest concentration of SARS-CoV-2 viral copies this assay can detect is 138 copies/mL. A negative result does not preclude SARS-Cov-2 infection and should not be used as the sole basis for treatment or other patient management decisions. A negative result may occur with  improper specimen collection/handling, submission of specimen other than nasopharyngeal swab, presence of viral mutation(s) within the areas targeted by this assay, and inadequate number of viral copies(<138 copies/mL). A negative result must be combined with clinical observations, patient history, and epidemiological information. The expected result is Negative.  Fact Sheet for Patients:  BloggerCourse.com  Fact Sheet for Healthcare Providers:  SeriousBroker.it  This test is no t yet approved or cleared by the [place] FDA and  has been authorized for detection and/or diagnosis of SARS-CoV-2 by FDA under an Emergency Use Authorization (EUA). This EUA will remain  in effect (meaning this test can be used) for the duration of the COVID-19 declaration under Section 564(b)(1) of the Act, 21 U.S.C.section 360bbb-3(b)(1), unless the authorization is terminated  or revoked sooner.       Influenza A by PCR NEGATIVE NEGATIVE  Final   Influenza B by PCR NEGATIVE NEGATIVE Final    Comment: (NOTE) The Xpert Xpress SARS-CoV-2/FLU/RSV plus assay is intended as an aid in the diagnosis of influenza from Nasopharyngeal swab specimens and should not be used as a sole basis for treatment. Nasal washings and aspirates are unacceptable for Xpert Xpress SARS-CoV-2/FLU/RSV testing.  Fact Sheet for Patients: BloggerCourse.com  Fact Sheet for Healthcare Providers: SeriousBroker.it  This test is not yet approved or cleared by the [place] FDA and has been authorized for detection and/or diagnosis of SARS-CoV-2 by FDA under an Emergency Use Authorization (EUA). This EUA will remain in effect (meaning this test can be used) for the duration of the COVID-19 declaration under Section 564(b)(1) of the Act, 21 U.S.C. section 360bbb-3(b)(1), unless the authorization is terminated or revoked.  Performed at [facility], [address] [place], [place] [address]     Please note: You were cared for by a hospitalist during your hospital stay. Once you are discharged, your primary care physician will handle any further medical issues. Please note that NO REFILLS for any discharge medications will be authorized once you are discharged, as it is imperative that you return to your primary care physician (or establish a relationship with a primary care physician if you do not have one) for your post hospital discharge needs so that they can reassess your need for medications and monitor your lab values.    Time coordinating discharge: 40 minutes  SIGNED:   Burnadette Pop, MD  [facility] 05/24/2021, 9:39 AM Pager [phone]  If 7PM-7AM, please contact night-coverage [email or web address] Password [email or web address]

## 2021-05-21 NOTE — Progress Notes (Signed)
Patient refused labs for the 2nd time. Patient was educated the importance of labs, patient stated that he understood. ?

## 2021-05-21 NOTE — Plan of Care (Signed)
  Problem: Activity: Goal: Risk for activity intolerance will decrease Outcome: Progressing   Problem: Nutrition: Goal: Adequate nutrition will be maintained Outcome: Progressing   Problem: Coping: Goal: Level of anxiety will decrease Outcome: Progressing   Problem: Elimination: Goal: Will not experience complications related to bowel motility Outcome: Progressing   

## 2021-05-21 NOTE — TOC Progression Note (Addendum)
Transition of Care (TOC) - Progression Note  ? ? ?Patient Details  ?Name: Marcus Mcgee ?MRN: [id number] ?Date of Birth: 1935-03-06 ? ?Transition of Care (TOC) CM/SW Contact  ?Lorri Frederick, LCSW ?Phone Number: ?05/21/2021, 12:31 PM ? ?Clinical Narrative:      ? ?CSW reached out to [facility] covering [place] multiple times by call, text, calling facility directly.  Unable to get response.   ? ?Unable to get response from [facility].  [facility] can accept pt today, [place] can accept pt today. ? ?1420: CSW informed that pt wife is asking for referrals to be sent out to additional SNF facilities.  At this point, CSW reached out to [facility] leadership for assistance with this discharge.   ? ?PT DOES HAVE UHC MEDICARE--IT IS NOT ON FACE SHEET.  MEMBER #[id number]. ? ?Expected Discharge Plan: Skilled Nursing Facility ?Barriers to Discharge:  (no SNF bed offer) ? ?Expected Discharge Plan and Services ?Expected Discharge Plan: Skilled Nursing Facility ?  ?Discharge Planning Services: CM Consult ?Post Acute Care Choice: Durable Medical Equipment (hospital bed) ?Living arrangements for the past 2 months: Single Family Home ?Expected Discharge Date: 05/21/21               ?  ?  ?  ?  ?  ?  ?HH Agency: [facility] ?Date HH Agency Contacted: 05/13/21 ?Time HH Agency Contacted: 1459 ?Representative spoke with at [facility] Agency: Barbara Cower ? ? ?Social Determinants of Health (SDOH) Interventions ?  ? ?Readmission Risk Interventions ?No flowsheet data found. ? ?

## 2021-05-21 NOTE — Progress Notes (Signed)
05/21/2021 1500 NCM called [facility] and [facility] . Voice messages left with admission coordinators requesting them to review pt via hub for acceptance. ?TOC team will continue to monitor and assist with needs.Marland Kitchen ?Gae Gallop RN,BSN,CM ?

## 2021-05-21 NOTE — TOC Progression Note (Addendum)
Transition of Care (TOC) - Progression Note  ? ? ?Patient Details  ?Name: Marcus Mcgee ?MRN: [id number] ?Date of Birth: 08-Aug-1934 ? ?Transition of Care (TOC) CM/SW Contact  ?Sharin Mons, RN ?Phone Number: ?05/21/2021, 9:39 AM ? ?Clinical Narrative:    ?NCM made pt/ wife aware of d/c plan. Per MD ptmedically ready for next level of care. [facility] with [facility]. NCM made them aware of [facility] and [facility]. Wife states not happy with offers, needs to speak with son and will f/u NCM in an hour. ? ?TOC team will continue to monitor and assist with needs.... ? ?05/21/2021 1024 Pt/wife now states interested in [facility]. CSW to f/u with facility for bed offer.... ? ? ?Expected Discharge Plan: [place] ?Barriers to Discharge:  (no SNF bed offer) ? ?Expected Discharge Plan and Services ?Expected Discharge Plan: [place] ?  ?Discharge Planning Services: CM Consult ?Post Acute Care Choice: Durable Medical Equipment (hospital bed) ?Living arrangements for the past 2 months: [place] ?                ?  ?  ?  ?  ?  ?  ?[place] Agency: [place] () ?Date HH Agency Contacted: 05/13/21 ?Time [place]: [id number] spoke with at [place]: Corene Cornea ? ? ?Social Determinants of Health (SDOH) Interventions ?  ? ?Readmission Risk Interventions ?No flowsheet data found. ? ?

## 2021-05-21 NOTE — Progress Notes (Signed)
Physical Therapy Treatment ?Patient Details ?Name: Marcus Mcgee ?MRN: [id number] ?DOB: April 06, 1934 ?Today's Date: 05/21/2021 ? ? ?History of Present Illness 87 yo admitted 2/28 after fall at home with Rt femur fx s/p ORIF 3/1. PMhx: bil TKA, bil THA, HTN, cOPD, CVA ? ?  ?PT Comments  ? ? Pt received supine and agreeable to session with better tolerance for maintaining upright posture without nausea. Pt requiring min assist to come sitting EOB after bed placed in chair position for pt acclimation to upright. Pt requiring max-total assist to come to standing in stedy from elevated EOB, max attempts to come to standing with total assist with RW, pt unable to generate power through LLE to achieve and also unable to maintain RLE NWB status. Pt seated in recliner with MD present at end of session without c/o nausea or dizziness. Pt continues to benefit from skilled PT services to progress toward functional mobility goals.  ?  ?Recommendations for follow up therapy are one component of a multi-disciplinary discharge planning process, led by the attending physician.  Recommendations may be updated based on patient status, additional functional criteria and insurance authorization. ? ?Follow Up Recommendations ? Skilled nursing-short term rehab (<3 hours/day) ?  ?  ?Assistance Recommended at Discharge Frequent or constant Supervision/Assistance  ?Patient can return home with the following Two people to help with walking and/or transfers;Two people to help with bathing/dressing/bathroom;Assist for transportation;Help with stairs or ramp for entrance;Direct supervision/assist for medications management;Assistance with cooking/housework ?  ?Equipment Recommendations ? None recommended by PT  ?  ?Recommendations for Other Services   ? ? ?  ?Precautions / Restrictions Precautions ?Precautions: Fall ?Restrictions ?Weight Bearing Restrictions: Yes ?RLE Weight Bearing: Non weight bearing ?Other Position/Activity Restrictions: Pt now  has a short boot on LLE but no reported WB changes for sprain ankle  ?  ? ?Mobility ? Bed Mobility ?Overal bed mobility: Needs Assistance ?Bed Mobility: Rolling, Supine to Sit, Sit to Supine ?Rolling: Supervision ?  ?Supine to sit: Min assist ?  ?  ?General bed mobility comments: placed bed in chair position prior to sitting EOB, light min assist for R LE over EOB ?  ? ?Transfers ?Overall transfer level: Needs assistance ?Equipment used: Rolling walker (2 wheels), Ambulation equipment used (stedy) ?Transfers: Sit to/from Stand ?Sit to Stand: Max assist, Total assist, +2 physical assistance ?  ?  ?  ?  ?  ?General transfer comment: max-total assist for pt to attempt standing with pt able to partially clear hips from EOB with RW. Max assist for pt to come to standing in stedy ?  ? ?Ambulation/Gait ?  ?  ?  ?  ?  ?  ?  ?  ? ? ?Stairs ?  ?  ?  ?  ?  ? ? ?Wheelchair Mobility ?  ? ?Modified Rankin (Stroke Patients Only) ?  ? ? ?  ?Balance Overall balance assessment: Needs assistance ?Sitting-balance support: No upper extremity supported ?Sitting balance-Leahy Scale: Fair ?Sitting balance - Comments: able to participate in ADLs in unsupported sitting ?  ?  ?  ?  ?  ?  ?  ?  ?  ?  ?  ?  ?  ?  ?  ?  ? ?  ?Cognition Arousal/Alertness: Awake/alert ?Behavior During Therapy: [facility] for tasks assessed/performed ?Overall Cognitive Status: Impaired/Different from baseline ?Area of Impairment: Following commands, Memory, Attention, Awareness, Problem solving ?  ?  ?  ?  ?  ?  ?  ?  ?  ?  Current Attention Level: Sustained ?Memory: Decreased short-term memory ?Following Commands: Follows one step commands with increased time ?  ?  ?Problem Solving: Slow processing, Decreased initiation, Difficulty sequencing, Requires verbal cues, Requires tactile cues ?General Comments: cognition continues to improve ?  ?  ? ?  ?Exercises General Exercises - Lower Extremity ?Ankle Circles/Pumps: AROM, Both, 20 reps, Supine ? ?  ?General Comments   ?  ?   ? ?Pertinent Vitals/Pain Pain Assessment ?Pain Assessment: Faces ?Faces Pain Scale: Hurts little more ?Pain Location: R LE ?Pain Descriptors / Indicators: Grimacing, Guarding, Sore, Moaning ?Pain Intervention(s): Limited activity within patient's tolerance, Monitored during session, Repositioned  ? ? ?Home Living   ?  ?  ?  ?  ?  ?  ?  ?  ?  ?   ?  ?Prior Function    ?  ?  ?   ? ?PT Goals (current goals can now be found in the care plan section) Acute Rehab PT Goals ?Patient Stated Goal: to walk ?PT Goal Formulation: With patient/family ?Time For Goal Achievement: 05/27/21 ? ?  ?Frequency ? ? ? Min 3X/week ? ? ? ?  ?PT Plan    ? ? ?Co-evaluation PT/OT/SLP Co-Evaluation/Treatment: Yes ?Reason for Co-Treatment: Complexity of the patient's impairments (multi-system involvement);For patient/therapist safety;To address functional/ADL transfers ?PT goals addressed during session: Mobility/safety with mobility;Balance;Proper use of DME ?  ?  ? ?  ?AM-PAC PT "6 Clicks" Mobility   ?Outcome Measure ? Help needed turning from your back to your side while in a flat bed without using bedrails?: A Lot ?Help needed moving from lying on your back to sitting on the side of a flat bed without using bedrails?: A Lot ?Help needed moving to and from a bed to a chair (including a wheelchair)?: A Lot ?Help needed standing up from a chair using your arms (e.g., wheelchair or bedside chair)?: Total ?Help needed to walk in hospital room?: Total ?Help needed climbing 3-5 steps with a railing? : Total ?6 Click Score: 9 ? ?  ?End of Session   ?Activity Tolerance: Patient limited by pain;Patient tolerated treatment well ?Patient left: in chair;with call bell/phone within reach;with chair alarm set;with family/visitor present;Other (comment) (with MD present) ?Nurse Communication: Mobility status (pt vomiting) ?PT Visit Diagnosis: Other abnormalities of gait and mobility (R26.89);Repeated falls (R29.6);Muscle weakness (generalized)  (M62.81);Unsteadiness on feet (R26.81);Pain ?Pain - Right/Left: Right ?Pain - part of body: Leg ?  ? ? ?Time: [id number] ?PT Time Calculation (min) (ACUTE ONLY): 34 min ? ?Charges:  $Therapeutic Activity: 8-22 mins          ?          ? ?Audry Riles. PTA ?Acute Rehabilitation Services ?Office: [phone] ? ? ? ?Betsey Holiday Ijeoma Loor ?05/21/2021, 10:05 AM ? ?

## 2021-05-21 NOTE — TOC Progression Note (Signed)
Transition of Care (TOC) - Progression Note  ? ? ?Patient Details  ?Name: Marcus Mcgee ?MRN: [id number] ?Date of Birth: 1934-10-25 ? ?Transition of Care (TOC) CM/SW Contact  ?Lockie Pares, RN ?Phone Number: ?05/21/2021, 5:47 PM ? ?Clinical Narrative:    ? ?[facility] Appeal ?Detailed notice of DC letter Created and saved- Yes ?Detailed notice document given to patient- yes ?[facility] ROI document created ?[facility] appeal documentes uploaded to [employer] Yes ? ?Expected Discharge Plan: Skilled Nursing Facility ?Barriers to Discharge:  (no SNF bed offer) ? ?Expected Discharge Plan and Services ?Expected Discharge Plan: Skilled Nursing Facility ?  ?Discharge Planning Services: CM Consult ?Post Acute Care Choice: Durable Medical Equipment (hospital bed) ?Living arrangements for the past 2 months: Single Family Home ?Expected Discharge Date: 05/21/21               ?  ?  ?  ?  ?  ?  ?HH [facility]: [facility] ?Date HH Agency Contacted: 05/13/21 ?Time HH Agency Contacted: 1459 ?Representative spoke with at [facility] Agency: Barbara Cower ? ? ?Social Determinants of Health (SDOH) Interventions ?  ? ?Readmission Risk Interventions ?No flowsheet data found. ? ?

## 2021-05-21 NOTE — Progress Notes (Signed)
Patient refused Labs this shift. ?

## 2021-05-22 MED ORDER — TRAZODONE HCL 50 MG PO TABS: 50.0000 mg | ORAL_TABLET | Freq: Every day | ORAL | Status: DC

## 2021-05-22 MED ORDER — TRAZODONE HCL 50 MG PO TABS
100.0000 mg | ORAL_TABLET | Freq: Every day | ORAL | Status: DC
  Administered 2021-05-22 – 2021-05-23 (×2): 100 mg via ORAL
  Filled 2021-05-22 (×2): qty 2

## 2021-05-22 NOTE — Progress Notes (Signed)
RN will assist pt when he is ready for CPAP at bedtime. Pt currently resting comfortably. RT will cont to monitor.  ?

## 2021-05-22 NOTE — Plan of Care (Signed)
  Problem: Coping: Goal: Level of anxiety will decrease Outcome: Progressing   Problem: Safety: Goal: Ability to remain free from injury will improve Outcome: Progressing   Problem: Skin Integrity: Goal: Risk for impaired skin integrity will decrease Outcome: Progressing   

## 2021-05-22 NOTE — TOC Progression Note (Signed)
Transition of Care (TOC) - Progression Note  ? ? ?Patient Details  ?Name: Marcus Mcgee ?MRN: [id number] ?Date of Birth: [birth year] ? ?Transition of Care (TOC) CM/SW Contact  ?Bess Kinds, RN ?Phone Number: [phone] ?05/22/2021, 3:53 PM ? ?Clinical Narrative:    ? ?Received faxed notification from [facility] for Case ID: [id number] that d/t lack of requested documents not being sent in required time frame it is not appropriate to end services at this time. Copy of notification provided to patient and spouse.  ? ?Discussed discharge planning with patient, spouse, son, and daughter-in-law. Family is agreeable to patient going to SNF for STR, however, the quality per Medicare guidelines of the SNFs offering beds is not satisfactory. Spouse indicates that she needs to advocate for her husband and ensure that he receives the best care.  ? ?Spouse is agreeable to [facility]. However, bed offer was not accepted while bed was still available. Spouse stated that while [facility] was not her first choice, she never declined the bed. Confirmed with admissions at [facility] that there are currently no beds available.  ? ?Spouse agreeable to [facility]. Unable to reach anyone in admissions.  ? ?Discussed potential for care at home. Patient has VA benefits, but spouse stated that PCP at [facility] in [place], Dr. Leonard Schwartz, indicated that patient needed to be in a SNF for STR for at least 20 days before he can get caregivers through [place]. Unable to confirm VA benefits on the weekend.  ? ?Patient does have DME at home to include a lift chair, hospital bed, walker, rollator, 3/1 and wheelchair. Family asking that PT or nursing assist patient with getting out of bed so that they can see what he can do. Discussed request with primary nurse and charge nurse.  ? ?TOC following for transition needs.  ? ? ?Expected Discharge Plan: Skilled Nursing Facility ?Barriers to Discharge:  (no SNF bed offer) ? ?Expected  Discharge Plan and Services ?Expected Discharge Plan: Skilled Nursing Facility ?  ?Discharge Planning Services: CM Consult ?Post Acute Care Choice: Durable Medical Equipment (hospital bed) ?Living arrangements for the past 2 months: Single Family Home ?Expected Discharge Date: 05/21/21               ?  ?  ?  ?  ?  ?  ?HH [facility]: [facility] ?Date HH Agency Contacted: 05/13/21 ?Time HH Agency Contacted: 1459 ?Representative spoke with at [facility] Agency: Barbara Cower ? ? ?Social Determinants of Health (SDOH) Interventions ?  ? ?Readmission Risk Interventions ?No flowsheet data found. ? ?

## 2021-05-22 NOTE — Progress Notes (Signed)
? ?  Subjective: ? ?Continues to have difficulty placing weight on left ankle making NWB restriction difficult. Pending SNF authorization. ? ?Objective:  ? ?VITALS:   ?Vitals:  ? 05/21/21 0749 05/21/21 1455 05/21/21 1950 05/22/21 0500  ?BP: 125/69 126/72 117/66 108/86  ?Pulse: 73 76 77 85  ?Resp: 15 12 20 19   ?Temp: 98 ?F (36.7 ?C) 98 ?F (36.7 ?C) 98.5 ?F (36.9 ?C) 98.6 ?F (37 ?C)  ?TempSrc: Oral Oral Oral Oral  ?SpO2: 99% 99% 98% 97%  ?Weight:      ?Height:      ? ? ?Dressing over right lower extremity is clean dry intact.  Is able to fire extensors of all of his toes as well as the flexors of the right foot.  Sensation is intact in all distributions of the right foot.  2+ dorsalis pedis pulse ? ?Left ankle with bruising tenderness over left ATFL. Minimal laxity with anterior drawer, calcaneal tilt but pain. No tenderness about malleoli. 2+ DP pulse. Neurosensory exam intact ? ? ?Lab Results  ?Component Value Date  ? WBC 12.3 (H) 05/20/2021  ? HGB 9.2 (L) 05/20/2021  ? HCT 28.2 (L) 05/20/2021  ? MCV 90.4 05/20/2021  ? PLT 383 05/20/2021  ? ? ? ?Assessment/Plan: ? ?10 Days Post-Op status post right femur open reduction internal fixation, left ankle sprain after fall ? ?- Expected postop acute blood loss anemia - monitor vitals ?- Patient to work with PT/OT to optimize mobilization safely ?- DVT ppx - SCDs, ambulation, Aspirin ?- NWB operative extremity, WBAT left leg with CAM boot for support ?- Pain control - multimodal pain management, ATC acetaminophen in conjunction with as needed narcotic (oxycodone), although this should be minimized with other modalities  ?- Discharge planning pending CM, appreciate coordination  ? ? ?07/20/2021 ?05/22/2021, 7:45 AM ? ?

## 2021-05-22 NOTE — Progress Notes (Signed)
Patient seen and examined the bedside this morning.  Hemodynamically stable.  Eating his breakfast.  As usual, he was putting frustration on the social workers.  Clearly explained that he is medically stable for discharge  and as recommended by PT/OT.  There is no new change in the medical management.  Medically stable for discharge. ?

## 2021-05-23 DIAGNOSIS — S7291XA Unspecified fracture of right femur, initial encounter for closed fracture: Secondary | ICD-10-CM | POA: Diagnosis not present

## 2021-05-23 NOTE — Plan of Care (Signed)
  Problem: Activity: Goal: Risk for activity intolerance will decrease Outcome: Progressing   Problem: Nutrition: Goal: Adequate nutrition will be maintained Outcome: Progressing   Problem: Coping: Goal: Level of anxiety will decrease Outcome: Progressing   Problem: Elimination: Goal: Will not experience complications related to bowel motility Outcome: Progressing   Problem: Pain Managment: Goal: General experience of comfort will improve Outcome: Progressing   

## 2021-05-23 NOTE — Progress Notes (Signed)
Patient declined CPAP use at this time. Equipment set up at bedside and patient aware to call for Respiratory if he would like to wear. ?

## 2021-05-23 NOTE — Progress Notes (Signed)
PROGRESS NOTE  Marcus Mcgee  [id number] DOB: Jan 26, 1935 DOA: 05/11/2021 PCP: Dineen Kid, MD   Brief Narrative:  Patient is 87 y.o. male past medical history significant for HTN, pacemaker with history arrhythmia, history of CVA who presents by EMS after a fall after this he noticed deformity of his right thigh EMS was called according to his wife he has been having difficulty with balance and has had couple of falls with no injuries. .  Family relates he has been falling for the last several months.  He was found to have fracture of the right femur.  Orthopedic surgery was consulted, had ORIF on 05/12/21.  Hospital course remarkable for acute blood loss anemia, confusion.  Mental status has improved.  Hospital course remarkable for slow improvement in the mobility. PT/OT recommending SNF on  discharge.  Medically stable for discharge.Waiting for placement.   Assessment & Plan:  Principal Problem:   Closed fracture of right femur, unspecified fracture morphology, initial encounter [facility]) Active Problems:   Hyponatremia   Pacemaker   Essential hypertension   COPD (chronic obstructive pulmonary disease) (HCC)   Prolonged QT interval   History of CVA (cerebrovascular accident)   Acute metabolic encephalopathy   Acute postoperative anemia due to expected blood loss   Assessment and Plan:  Closed fracture of right femur, unspecified fracture morphology, initial encounter [facility]) Orthopedic surgery consulted, status post ORIF of the distal femur on 05/12/2021 Continue ASA 325 mg as DVT ppx PT/OT- SNF Patient also sprained his left ankle, currently on a short boot   Acute postoperative anemia due to expected blood loss Postop surgical hemoglobin 5.9, transfused 2 units of PRBC--> 7-->6.8 transfused 1U of PRBC on 05/15/21 Currently hemoglobin stable in the range of 9.  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   Prolonged QT interval Avoid medications which could further prolong QT interval.On flecainide   COPD (chronic obstructive pulmonary disease) (HCC) Currently stable, continue bronchodilators as needed.   Essential hypertension Monitor BP.  Continue current medications   Pacemaker stable   Hyponatremia Resolved          DVT prophylaxis:SCDs Start: 05/11/21 2322     Code Status: Full Code  Family Communication: Wife at bedside on 3/11  Patient status:Inpatient  Patient is from :Home  Anticipated discharge to:SNF  Estimated DC date: As soon as bed is available at SNF  Consultants: Orthopedics  Procedures: ORIF  Antimicrobials:  Anti-infectives (From admission, onward)    Start     Dose/Rate Route Frequency Ordered Stop   05/13/21 0600  ceFAZolin (ANCEF) IVPB 2g/100 mL premix        2 g 200 mL/hr over 30 Minutes Intravenous On call to O.R. 05/12/21 1659 05/13/21 0604   05/12/21 2330  ceFAZolin (ANCEF) IVPB 2g/100 mL premix        2 g 200 mL/hr over 30 Minutes Intravenous Every 8 hours 05/12/21 2237 05/13/21 0557   05/12/21 1943  vancomycin (VANCOCIN) powder  Status:  Discontinued          As needed 05/12/21 1943 05/12/21 2107  05/12/21 1633  ceFAZolin (ANCEF) 2-4 GM/100ML-% IVPB       Note to Pharmacy: Cameron Sprang M: cabinet override      05/12/21 1633 05/13/21 0444       Subjective:  Patient seen and examined at the bedside this morning.  Hemodynamically stable.  Comfortable, about to eat breakfast.  No new complaints.   Objective: Vitals:   05/22/21 0755 05/22/21 1221 05/22/21 1900  05/23/21 0537  BP: 120/67 124/80 131/65 110/79  Pulse: 75 88 80 80  Resp: 15 15 13 12   Temp: 98.5 F (36.9 C) 98.1 F (36.7 C) 98.3 F (36.8 C) 98.3 F (36.8 C)  TempSrc: Oral Oral Oral Oral  SpO2: 96% 98% 96% 96%  Weight:      Height:        Intake/Output Summary (Last 24 hours) at 05/23/2021 1011 Last data filed at 05/22/2021 1830 Gross per 24 hour  Intake --  Output 600 ml  Net -600 ml    Filed Weights   05/11/21 2152  Weight: 90.7 kg    Examination:  General exam: Overall comfortable, not in distress HEENT: PERRL Respiratory system:  no wheezes or crackles  Cardiovascular system: S1 & S2 heard, RRR.  Gastrointestinal system: Abdomen is nondistended, soft and nontender. Central nervous system: Alert and oriented Extremities: No edema, no clubbing ,no cyanosis,surgical wound on the right thigh, short boot on the left leg Skin: No rashes, no ulcers,no icterus     Data Reviewed: I have personally reviewed following labs and imaging studies  CBC: Recent Labs  Lab 05/17/21 0620 05/18/21 0247 05/19/21 0629 05/20/21 0142  WBC 8.7 12.4* 12.0* 12.3*  NEUTROABS 6.8 10.2* 9.7* 8.9*  HGB 9.1* 9.6* 9.3* 9.2*  HCT 26.2* 27.4* 27.8* 28.2*  MCV 87.3 85.9 88.0 90.4  PLT 249 288 356 [id number]   Basic Metabolic Panel: Recent Labs  Lab 05/17/21 0620 05/18/21 0247 05/19/21 0631 05/20/21 0142  NA 133* 127* 130* 137  K 3.7 3.3* 3.3* 3.8  CL 101 97* 99 106  CO2 24 21* 21* 21*  GLUCOSE 132* 136* 134* 125*  BUN 10 13 24* 25*  CREATININE 0.75 0.74 0.93 0.91  CALCIUM 8.5* 8.6* 8.5* 8.7*  MG  --  1.8  --   --      Recent Results (from the past 240 hour(s))  Urine Culture     Status: Abnormal   Collection Time: 05/18/21  9:50 PM   Specimen: Urine, Clean Catch  Result Value Ref Range Status   Specimen Description URINE, CLEAN CATCH  Final   Special Requests NONE  Final   Culture (A)  Final    <10,000 COLONIES/mL INSIGNIFICANT GROWTH Performed at [facility],  [address] [place], [place] [address]    Report Status 05/20/2021 FINAL  Final  Culture, blood (routine x 2)     Status: None (Preliminary result)   Collection Time: 05/19/21  6:29 AM   Specimen: BLOOD LEFT ARM  Result Value Ref Range Status   Specimen Description BLOOD LEFT ARM  Final   Special Requests   Final    BOTTLES DRAWN AEROBIC AND ANAEROBIC Blood Culture adequate volume   Culture   Final    NO GROWTH 4 DAYS Performed at [facility], [place] [address] [place], [place] [address]    Report Status PENDING  Incomplete  Culture, blood (routine x 2)     Status: None (Preliminary result)   Collection Time: 05/19/21  6:36  AM   Specimen: BLOOD LEFT FOREARM  Result Value Ref Range Status   Specimen Description BLOOD LEFT FOREARM  Final   Special Requests   Final    BOTTLES DRAWN AEROBIC AND ANAEROBIC Blood Culture adequate volume   Culture   Final    NO GROWTH 4 DAYS Performed at [facility], [address], [place], [place] [address]    Report Status PENDING  Incomplete  Resp Panel by RT-PCR (Flu A&B, Covid) Nasopharyngeal Swab     Status: None   Collection Time: 05/20/21 12:38 PM   Specimen: Nasopharyngeal Swab; Nasopharyngeal(NP) swabs in vial transport medium  Result Value Ref Range Status   SARS Coronavirus 2 by RT PCR NEGATIVE NEGATIVE Final    Comment: (NOTE) SARS-CoV-2 target nucleic acids are NOT DETECTED.  The SARS-CoV-2 RNA is generally detectable in upper respiratory specimens during the acute phase of infection. The lowest concentration of SARS-CoV-2 viral copies this assay can detect is 138 copies/mL. A negative result does not preclude SARS-Cov-2 infection and should not be used as the sole basis for treatment or other patient management decisions. A negative result may occur with  improper specimen collection/handling, submission of specimen other than nasopharyngeal swab, presence of viral mutation(s) within the areas targeted by this assay, and  inadequate number of viral copies(<138 copies/mL). A negative result must be combined with clinical observations, patient history, and epidemiological information. The expected result is Negative.  Fact Sheet for Patients:  BloggerCourse.com  Fact Sheet for Healthcare Providers:  SeriousBroker.it  This test is no t yet approved or cleared by the [place] FDA and  has been authorized for detection and/or diagnosis of SARS-CoV-2 by FDA under an Emergency Use Authorization (EUA). This EUA will remain  in effect (meaning this test can be used) for the duration of the COVID-19 declaration under Section 564(b)(1) of the Act, 21 U.S.C.section 360bbb-3(b)(1), unless the authorization is terminated  or revoked sooner.       Influenza A by PCR NEGATIVE NEGATIVE Final   Influenza B by PCR NEGATIVE NEGATIVE Final    Comment: (NOTE) The Xpert Xpress SARS-CoV-2/FLU/RSV plus assay is intended as an aid in the diagnosis of influenza from Nasopharyngeal swab specimens and should not be used as a sole basis for treatment. Nasal washings and aspirates are unacceptable for Xpert Xpress SARS-CoV-2/FLU/RSV testing.  Fact Sheet for Patients: BloggerCourse.com  Fact Sheet for Healthcare Providers: SeriousBroker.it  This test is not yet approved or cleared by the [place] FDA and has been authorized for detection and/or diagnosis of SARS-CoV-2 by FDA under an Emergency Use Authorization (EUA). This EUA will remain in effect (meaning this test can be used) for the duration of the COVID-19 declaration under Section 564(b)(1) of the Act, 21 U.S.C. section 360bbb-3(b)(1), unless the authorization is terminated or revoked.  Performed at [facility], [address] [place], [place] [address]      Radiology Studies: No results found.  Scheduled Meds:  aspirin  300 mg Rectal Daily    Or   aspirin EC  325 mg Oral Daily   atorvastatin  40 mg Oral QHS   feeding supplement  237 mL Oral BID BM   finasteride  5 mg Oral QHS   flecainide  100 mg Oral BID   pantoprazole  40 mg Oral Daily   pregabalin  100 mg Oral BID   sertraline  100 mg Oral q morning   traZODone  100 mg Oral QHS  Continuous Infusions:   LOS: 12 days   Shelly Coss, MD [facility] P3/02/2022, 10:11 AM

## 2021-05-24 LAB — CULTURE, BLOOD (ROUTINE X 2)
Culture: NO GROWTH
Culture: NO GROWTH
Special Requests: ADEQUATE
Special Requests: ADEQUATE

## 2021-05-24 NOTE — TOC Progression Note (Addendum)
Transition of Care (TOC) - Progression Note  ? ? ?Patient Details  ?Name: Marcus Mcgee ?MRN: [id number] ?Date of Birth: 07-26-1934 ? ?Transition of Care (TOC) CM/SW Contact  ?Lorri Frederick, LCSW ?Phone Number: ?05/24/2021, 11:06 AM ? ?Clinical Narrative:   CSW spoke with Kristal from [facility] who confirmed she can accept pt today.   ? ?Pt needed new PT note for new insurance auth. ? ?1050: PT note in, new SNF auth request submitted to [facility].  ? ?1300: auth approved: [id number].  3 days: 3/13-3/15 ? ?Expected Discharge Plan: Skilled Nursing Facility ?Barriers to Discharge:  (no SNF bed offer) ? ?Expected Discharge Plan and Services ?Expected Discharge Plan: Skilled Nursing Facility ?  ?Discharge Planning Services: CM Consult ?Post Acute Care Choice: Durable Medical Equipment (hospital bed) ?Living arrangements for the past 2 months: Single Family Home ?Expected Discharge Date: 05/21/21               ?  ?  ?  ?  ?  ?  ?HH Agency: [facility] ?Date HH Agency Contacted: 05/13/21 ?Time HH Agency Contacted: 1459 ?Representative spoke with at [facility] Agency: Barbara Cower ? ? ?Social Determinants of Health (SDOH) Interventions ?  ? ?Readmission Risk Interventions ?No flowsheet data found. ? ?

## 2021-05-24 NOTE — TOC Transition Note (Signed)
Transition of Care (TOC) - CM/SW Discharge Note ? ? ?Patient Details  ?Name: Marcus Mcgee ?MRN: [id number] ?Date of Birth: September 23, 1934 ? ?Transition of Care [facility]) CM/SW Contact:  ?Lorri Frederick, LCSW ?Phone Number: ?05/24/2021, 1:30 PM ? ? ?Clinical Narrative:   Pt discharging to [facility].  RN call report to [phone].  ? ?Family was notified of DC by [facility] ? ?Final next level of care: Skilled Nursing Facility ?Barriers to Discharge: Barriers Resolved ? ? ?Patient Goals and CMS Choice ?  ?  ?Choice offered to / list presented to : Patient ? ?Discharge Placement ?  ?           ?Patient chooses bed at:  [facility]) ?Patient to be transferred to facility by: [facility] ?  ?  ? ?Discharge Plan and Services ?  ?Discharge Planning Services: CM Consult ?Post Acute Care Choice: Durable Medical Equipment (hospital bed)          ?  ?  ?  ?  ?  ?  ?HH Agency: [facility] ?Date HH Agency Contacted: 05/13/21 ?Time HH Agency Contacted: 1459 ?Representative spoke with at [facility] Agency: Barbara Cower ? ?Social Determinants of Health (SDOH) Interventions ?  ? ? ?Readmission Risk Interventions ?No flowsheet data found. ? ? ? ? ?

## 2021-05-24 NOTE — TOC Progression Note (Signed)
Transition of Care (TOC) - Progression Note  ? ? ?Patient Details  ?Name: Marcus Mcgee ?MRN: [id number] ?Date of Birth: 05-10-1934 ? ?Transition of Care (TOC) CM/SW Contact  ?Bess Kinds, RN ?Phone Number: [phone] ?05/24/2021, 1:08 PM ? ?Clinical Narrative:    ? ?Spoke with patient and spouse at the bedside this morning to advise of bed offer from [facility]. Acceptance of bed offer confirmed. Discussed process for updating authorization. Advised that admissions at [facility] will contact her concerning paperwork.  ? ?Expected Discharge Plan: Skilled Nursing Facility ?Barriers to Discharge:  (no SNF bed offer) ? ?Expected Discharge Plan and Services ?Expected Discharge Plan: Skilled Nursing Facility ?  ?Discharge Planning Services: CM Consult ?Post Acute Care Choice: Durable Medical Equipment (hospital bed) ?Living arrangements for the past 2 months: Single Family Home ?Expected Discharge Date: 05/21/21               ?  ?  ?  ?  ?  ?  ?HH Agency: [facility] ?Date HH Agency Contacted: 05/13/21 ?Time HH Agency Contacted: 1459 ?Representative spoke with at [facility] Agency: Barbara Cower ? ? ?Social Determinants of Health (SDOH) Interventions ?  ? ?Readmission Risk Interventions ?No flowsheet data found. ? ?

## 2021-05-24 NOTE — Progress Notes (Signed)
Patient seen and examined at the bedside this morning.  Hemodynamically stable.  No acute medical problems.  He is waiting for bed at skilled nursing facility.  Discharge orders and summary are already in.  Medically stable for discharge whenever possible.  No new change in the medical management ?

## 2021-05-24 NOTE — Progress Notes (Signed)
Physical Therapy Treatment ?Patient Details ?Name: Marcus Mcgee ?MRN: [id number] ?DOB: 11-Aug-1934 ?Today's Date: 05/24/2021 ? ? ?History of Present Illness 87 yo admitted 2/28 after fall at home with Rt femur fx s/p ORIF 3/1. PMhx: bil TKA, bil THA, HTN, cOPD, CVA ? ?  ?PT Comments  ? ? Pt seen after breakfast, no reported nausea, some c/o head feeling "swimmy" when sitting EOB but BP stable. Pt able to come to sitting EOB with min assist for LE management after tolerating chair position of bed. Pt requiring max-total assist to attempt standing with RW with pt gaining more hip clearance when pushing from bed with BUE's. Pt continues to have difficulty maintaing NWB status of RLE. Pt returned tp supine and bed placed in chair position at end of session. Pt continues to benefit from skilled PT services to progress toward functional mobility goals.  ?  ?Recommendations for follow up therapy are one component of a multi-disciplinary discharge planning process, led by the attending physician.  Recommendations may be updated based on patient status, additional functional criteria and insurance authorization. ? ?Follow Up Recommendations ? Skilled nursing-short term rehab (<3 hours/day) ?  ?  ?Assistance Recommended at Discharge Frequent or constant Supervision/Assistance  ?Patient can return home with the following Two people to help with walking and/or transfers;Two people to help with bathing/dressing/bathroom;Assist for transportation;Help with stairs or ramp for entrance;Direct supervision/assist for medications management;Assistance with cooking/housework ?  ?Equipment Recommendations ? None recommended by PT  ?  ?Recommendations for Other Services   ? ? ?  ?Precautions / Restrictions Precautions ?Precautions: Fall ?Restrictions ?Weight Bearing Restrictions: Yes ?RLE Weight Bearing: Non weight bearing ?Other Position/Activity Restrictions: Pt now has a short boot on LLE but no reported WB changes for sprain ankle  ?   ? ?Mobility ? Bed Mobility ?Overal bed mobility: Needs Assistance ?Bed Mobility: Supine to Sit, Sit to Supine ?  ?  ?Supine to sit: Min assist ?Sit to supine: Min guard ?  ?General bed mobility comments: placed bed in chair position prior to sitting EOB, light min assist for RLE long sitting<>EOB ?  ? ?Transfers ?Overall transfer level: Needs assistance ?Equipment used: Rolling walker (2 wheels), Ambulation equipment used (stedy) ?Transfers: Sit to/from Stand ?Sit to Stand: Max assist, Total assist ?  ?  ?  ?  ?  ?General transfer comment: max-total assist for pt to attempt standing with pt able to minimally clear hips from EOB with RW. better clearance with pt pusing with BUE from bed. ?  ? ?Ambulation/Gait ?  ?  ?  ?  ?  ?  ?  ?  ? ? ?Stairs ?  ?  ?  ?  ?  ? ? ?Wheelchair Mobility ?  ? ?Modified Rankin (Stroke Patients Only) ?  ? ? ?  ?Balance Overall balance assessment: Needs assistance ?Sitting-balance support: No upper extremity supported ?Sitting balance-Leahy Scale: Fair ?Sitting balance - Comments: able to participate in ADLs in unsupported sitting ?  ?  ?  ?  ?  ?  ?  ?  ?  ?  ?  ?  ?  ?  ?  ?  ? ?  ?Cognition Arousal/Alertness: Awake/alert ?Behavior During Therapy: [facility] for tasks assessed/performed ?Overall Cognitive Status: Impaired/Different from baseline ?Area of Impairment: Following commands, Memory, Attention, Awareness, Problem solving ?  ?  ?  ?  ?  ?  ?  ?  ?  ?Current Attention Level: Sustained ?Memory: Decreased short-term memory ?Following Commands: Follows one  step commands with increased time ?  ?  ?Problem Solving: Slow processing, Decreased initiation, Difficulty sequencing, Requires verbal cues, Requires tactile cues ?General Comments: cognition continues to improve ?  ?  ? ?  ?Exercises General Exercises - Lower Extremity ?Ankle Circles/Pumps: AROM, Both, 20 reps, Supine ?Long Arc Quad: AROM, Right, Left, 10 reps, Seated ? ?  ?General Comments   ?  ?  ? ?Pertinent Vitals/Pain Pain  Assessment ?Pain Assessment: Faces ?Faces Pain Scale: Hurts a little bit ?Pain Location: R LE ?Pain Descriptors / Indicators: Grimacing, Guarding, Moaning ?Pain Intervention(s): Limited activity within patient's tolerance, Monitored during session, Repositioned  ? ? ?Home Living   ?  ?  ?  ?  ?  ?  ?  ?  ?  ?   ?  ?Prior Function    ?  ?  ?   ? ?PT Goals (current goals can now be found in the care plan section) Acute Rehab PT Goals ?Patient Stated Goal: to walk ?PT Goal Formulation: With patient/family ?Time For Goal Achievement: 05/27/21 ? ?  ?Frequency ? ? ? Min 3X/week ? ? ? ?  ?PT Plan    ? ? ?Co-evaluation PT/OT/SLP Co-Evaluation/Treatment: Yes ?  ?  ?  ?  ? ?  ?AM-PAC PT "6 Clicks" Mobility   ?Outcome Measure ? Help needed turning from your back to your side while in a flat bed without using bedrails?: A Lot ?Help needed moving from lying on your back to sitting on the side of a flat bed without using bedrails?: A Lot ?Help needed moving to and from a bed to a chair (including a wheelchair)?: A Lot ?Help needed standing up from a chair using your arms (e.g., wheelchair or bedside chair)?: Total ?Help needed to walk in hospital room?: Total ?Help needed climbing 3-5 steps with a railing? : Total ?6 Click Score: 9 ? ?  ?End of Session Equipment Utilized During Treatment: Gait belt ?Activity Tolerance: Patient tolerated treatment well ?Patient left: in bed;with call bell/phone within reach;with bed alarm set;with family/visitor present (with bed in chair position) ?Nurse Communication: Mobility status ?PT Visit Diagnosis: Other abnormalities of gait and mobility (R26.89);Repeated falls (R29.6);Muscle weakness (generalized) (M62.81);Unsteadiness on feet (R26.81);Pain ?Pain - Right/Left: Right ?Pain - part of body: Leg ?  ? ? ?Time: 6237-6283 ?PT Time Calculation (min) (ACUTE ONLY): 26 min ? ?Charges:  $Therapeutic Activity: 23-37 mins          ?          ? ?Marcus Boys. PTA ?Acute Rehabilitation Services ?Office:  [phone] ? ? ? ?Marcus Mcgee ?05/24/2021, 10:49 AM ? ?

## 2021-05-24 NOTE — TOC Transition Note (Signed)
Transition of Care (TOC) - CM/SW Discharge Note ? ? ?Patient Details  ?Name: Marcus Mcgee ?MRN: [id number] ?Date of Birth: [birth year] ? ?Transition of Care (TOC) CM/SW Contact:  ?Bess Kinds, RN ?Phone Number: [phone] ?05/24/2021, 2:51 PM ? ? ?Clinical Narrative:    ? ?Spoke with patient's spouse to advise of authorization and PTAR arranged. Spouse expressed concerns about not having signed admission paperwork. Spoke with Adella Nissen in admissions who advised that patient is clear to be admitted and paperwork will be completed tomorrow. Spouse aware. PT[facility] arrived to transport patient to [facility]. No further TOC needs idenftified at this time.  ? ?Final next level of care: Skilled Nursing Facility ?Barriers to Discharge: Barriers Resolved ? ? ?Patient Goals and CMS Choice ?  ?  ?Choice offered to / list presented to : Patient ? ?Discharge Placement ?  ?           ?Patient chooses bed at:  [facility]) ?Patient to be transferred to facility by: PTAR ?  ?  ? ?Discharge Plan and Services ?  ?Discharge Planning Services: CM Consult ?Post Acute Care Choice: Durable Medical Equipment (hospital bed)          ?  ?  ?  ?  ?  ?  ?HH Agency: [facility] ?Date HH Agency Contacted: 05/13/21 ?Time HH Agency Contacted: 1459 ?Representative spoke with at [facility] Agency: Barbara Cower ? ?Social Determinants of Health (SDOH) Interventions ?  ? ? ?Readmission Risk Interventions ?No flowsheet data found. ? ? ? ? ?

## 2021-05-27 ENCOUNTER — Other Ambulatory Visit (HOSPITAL_BASED_OUTPATIENT_CLINIC_OR_DEPARTMENT_OTHER): Payer: Self-pay | Admitting: Orthopaedic Surgery

## 2021-05-27 ENCOUNTER — Telehealth: Payer: Self-pay | Admitting: Orthopaedic Surgery

## 2021-05-27 DIAGNOSIS — S7291XA Unspecified fracture of right femur, initial encounter for closed fracture: Secondary | ICD-10-CM

## 2021-05-27 NOTE — Telephone Encounter (Signed)
Attempted to contact patient multiple times yesterday to schedule. ROV. No answer/LMOVM to call back to reschedule  ?

## 2021-05-28 ENCOUNTER — Ambulatory Visit (INDEPENDENT_AMBULATORY_CARE_PROVIDER_SITE_OTHER): Payer: Medicare Other | Admitting: Orthopaedic Surgery

## 2021-05-28 ENCOUNTER — Ambulatory Visit (HOSPITAL_BASED_OUTPATIENT_CLINIC_OR_DEPARTMENT_OTHER)
Admission: RE | Admit: 2021-05-28 | Discharge: 2021-05-28 | Disposition: A | Discharge status: Home / Self Care | Payer: No Typology Code available for payment source | Source: Ambulatory Visit | Attending: Orthopaedic Surgery | Admitting: Orthopaedic Surgery

## 2021-05-28 ENCOUNTER — Other Ambulatory Visit: Payer: Self-pay

## 2021-05-28 DIAGNOSIS — S7291XA Unspecified fracture of right femur, initial encounter for closed fracture: Secondary | ICD-10-CM

## 2021-05-28 NOTE — Progress Notes (Signed)
? ?                            ? ? ?Post Operative Evaluation ?  ? ?Procedure/Date of Surgery: Status post right distal femur open reduction internal fixation May 12, 2021 ? ?Interval History:  ? ? ?Presents today status post the above procedure.  He is overall doing very well.  He has been at his rehab facility.  He is working on regaining his mobility.  He has been able to get up with a walker.  He has been compliant with nonweightbearing. ? ? ?PMH/PSH/Family History/Social History/Meds/Allergies:   ? ?Past Medical History:  ?Diagnosis Date  ? Arthritis   ? COPD (chronic obstructive pulmonary disease) ([place])   ? Hypertension   ? Stroke [facility])   ? ?Past Surgical History:  ?Procedure Laterality Date  ? eyelid surgery Bilateral 11/25/2019  ? INSERT / REPLACE / REMOVE PACEMAKER    ? ORIF FEMUR FRACTURE Right 05/12/2021  ? Procedure: OPEN REDUCTION INTERNAL FIXATION (ORIF) DISTAL FEMUR FRACTURE;  Surgeon: Vanetta Mulders, MD;  Location: [place];  Service: Orthopedics;  Laterality: Right;  ? REPLACEMENT TOTAL KNEE Bilateral   ? TOTAL HIP ARTHROPLASTY Bilateral   ? ?Social History  ? ?Socioeconomic History  ? Marital status: Married  ?  Spouse name: Not on file  ? Number of children: Not on file  ? Years of education: Not on file  ? Highest education level: Not on file  ?Occupational History  ? Not on file  ?Tobacco Use  ? Smoking status: Never  ? Smokeless tobacco: Never  ?Vaping Use  ? Vaping Use: Never used  ?Substance and Sexual Activity  ? Alcohol use: Never  ? Drug use: Never  ? Sexual activity: Not on file  ?Other Topics Concern  ? Not on file  ?Social History Narrative  ? Not on file  ? ?Social Determinants of Health  ? ?Financial Resource Strain: Not on file  ?Food Insecurity: Not on file  ?Transportation Needs: Not on file  ?Physical Activity: Not on file  ?Stress: Not on file  ?Social Connections: Not on file  ? ?No family history on file. ?No Known Allergies ?Current Outpatient Medications  ?Medication Sig  Dispense Refill  ? acetaminophen (TYLENOL) 500 MG tablet Take 1,000 mg by mouth daily as needed for headache.    ? aspirin EC 325 MG tablet Take 325 mg by mouth every morning.    ? atorvastatin (LIPITOR) 20 MG tablet Take 40 mg by mouth at bedtime.    ? Cholecalciferol (VITAMIN D3) 50 MCG (2000 UT) TABS Take 2,000 Units by mouth every morning.    ? docusate sodium (COLACE) 100 MG capsule Take 100 mg by mouth every morning.    ? finasteride (PROSCAR) 5 MG tablet Take 5 mg by mouth at bedtime.    ? flecainide (TAMBOCOR) 100 MG tablet Take 100 mg by mouth 2 (two) times daily.    ? fludrocortisone (FLORINEF) 0.1 MG tablet Take 0.4 mcg by mouth daily as needed (SBP <80). Take 30 meq potassium with each dose    ? fluocinonide (LIDEX) 0.05 % external solution Apply 1 application topically 2 (two) times daily as needed (scalp itching).    ? fluticasone (FLONASE) 50 MCG/ACT nasal spray Place 2 sprays into both nostrils daily as needed for allergies or rhinitis (congestion).    ? hydrocortisone 2.5 % cream Apply 1 application topically 2 (two) times daily as needed (  itching).    ? omeprazole (PRILOSEC) 20 MG capsule Take 20 mg by mouth at bedtime.    ? oxyCODONE-acetaminophen (PERCOCET/ROXICET) 5-325 MG tablet Take 1 tablet by mouth 4 (four) times daily as needed (pain). 15 tablet 0  ? potassium chloride (MICRO-K) 10 MEQ CR capsule Take 30 mEq by mouth daily as needed (with each dose of fludrocortisone).    ? pregabalin (LYRICA) 200 MG capsule Take 200 mg by mouth 2 (two) times daily.    ? sertraline (ZOLOFT) 100 MG tablet Take 100 mg by mouth every morning.    ? Tetrahydrozoline HCl (VISINE OP) Place 1 drop into both eyes daily as needed (dry eyes).    ? traZODone (DESYREL) 100 MG tablet Take 200 mg by mouth at bedtime.    ? vitamin B-12 (CYANOCOBALAMIN) 1000 MCG tablet Take 1,000 mcg by mouth every morning.    ? ?No current facility-administered medications for this visit.  ? ?No results found. ? ?Review of Systems:   ?A  ROS was performed including pertinent positives and negatives as documented in the HPI. ? ? ?Musculoskeletal Exam:   ? ?There were no vitals taken for this visit. ? ?Right lateral incision is well-healed.  No erythema no drainage.  He is able to extend at the right knee although weakly.  Fires his EHL as well as dorsiflexors.  Sensation is intact distally ? ?Imaging:   ? ?X-ray right femur 2 views: ?Status post open reduction internal fixation without evidence of hardware complication ? ?I personally reviewed and interpreted the radiographs. ? ? ?Assessment:   ?87 year old male who is 2 weeks status post right femur open reduction internal fixation.  At this time I would like him to remain nonweightbearing on the right leg.  He will work on range of motion and strengthening of the right knee and right hip.  I will see him back in 4 weeks we will plan to obtain x-rays and potentially progress his weightbearing at that time. ? ?Plan :   ? ?-Return to clinic 4 weeks ? ? ? ? ?I personally saw and evaluated the patient, and participated in the management and treatment plan. ? ?Vanetta Mulders, MD ?Attending Physician, Orthopedic Surgery ? ?This document was dictated using Systems analyst. A reasonable attempt at proof reading has been made to minimize errors. ?

## 2021-06-24 ENCOUNTER — Other Ambulatory Visit (HOSPITAL_BASED_OUTPATIENT_CLINIC_OR_DEPARTMENT_OTHER): Payer: Self-pay | Admitting: Orthopaedic Surgery

## 2021-06-24 DIAGNOSIS — S7291XA Unspecified fracture of right femur, initial encounter for closed fracture: Secondary | ICD-10-CM

## 2021-06-25 ENCOUNTER — Ambulatory Visit (INDEPENDENT_AMBULATORY_CARE_PROVIDER_SITE_OTHER): Payer: No Typology Code available for payment source | Admitting: Orthopaedic Surgery

## 2021-06-25 ENCOUNTER — Ambulatory Visit (HOSPITAL_BASED_OUTPATIENT_CLINIC_OR_DEPARTMENT_OTHER)
Admission: RE | Admit: 2021-06-25 | Discharge: 2021-06-25 | Disposition: A | Discharge status: Home / Self Care | Payer: Medicare Other | Source: Ambulatory Visit | Attending: Orthopaedic Surgery | Admitting: Orthopaedic Surgery

## 2021-06-25 DIAGNOSIS — S7291XA Unspecified fracture of right femur, initial encounter for closed fracture: Secondary | ICD-10-CM | POA: Diagnosis not present

## 2021-06-25 NOTE — Progress Notes (Signed)
? ?                            ? ? ?Post Operative Evaluation ?  ? ?Procedure/Date of Surgery: Status post right distal femur open reduction internal fixation May 12, 2021 ? ?Interval History:  ? ?Presents today for follow-up status post right femoral fixation.  Overall he is doing very well.  His pain has been much better controlled.  He has been compliant with nonweightbearing.  He is working with physical therapy in his house.  He is working on strengthening of the knee ? ? ?PMH/PSH/Family History/Social History/Meds/Allergies:   ? ?Past Medical History:  ?Diagnosis Date  ? Arthritis   ? COPD (chronic obstructive pulmonary disease) ([place])   ? Hypertension   ? Stroke [facility])   ? ?Past Surgical History:  ?Procedure Laterality Date  ? eyelid surgery Bilateral 11/25/2019  ? INSERT / REPLACE / REMOVE PACEMAKER    ? ORIF FEMUR FRACTURE Right 05/12/2021  ? Procedure: OPEN REDUCTION INTERNAL FIXATION (ORIF) DISTAL FEMUR FRACTURE;  Surgeon: Vanetta Mulders, MD;  Location: [place];  Service: Orthopedics;  Laterality: Right;  ? REPLACEMENT TOTAL KNEE Bilateral   ? TOTAL HIP ARTHROPLASTY Bilateral   ? ?Social History  ? ?Socioeconomic History  ? Marital status: Married  ?  Spouse name: Not on file  ? Number of children: Not on file  ? Years of education: Not on file  ? Highest education level: Not on file  ?Occupational History  ? Not on file  ?Tobacco Use  ? Smoking status: Never  ? Smokeless tobacco: Never  ?Vaping Use  ? Vaping Use: Never used  ?Substance and Sexual Activity  ? Alcohol use: Never  ? Drug use: Never  ? Sexual activity: Not on file  ?Other Topics Concern  ? Not on file  ?Social History Narrative  ? Not on file  ? ?Social Determinants of Health  ? ?Financial Resource Strain: Not on file  ?Food Insecurity: Not on file  ?Transportation Needs: Not on file  ?Physical Activity: Not on file  ?Stress: Not on file  ?Social Connections: Not on file  ? ?No family history on file. ?No Known Allergies ?Current Outpatient  Medications  ?Medication Sig Dispense Refill  ? acetaminophen (TYLENOL) 500 MG tablet Take 1,000 mg by mouth daily as needed for headache.    ? aspirin EC 325 MG tablet Take 325 mg by mouth every morning.    ? atorvastatin (LIPITOR) 20 MG tablet Take 40 mg by mouth at bedtime.    ? Cholecalciferol (VITAMIN D3) 50 MCG (2000 UT) TABS Take 2,000 Units by mouth every morning.    ? docusate sodium (COLACE) 100 MG capsule Take 100 mg by mouth every morning.    ? finasteride (PROSCAR) 5 MG tablet Take 5 mg by mouth at bedtime.    ? flecainide (TAMBOCOR) 100 MG tablet Take 100 mg by mouth 2 (two) times daily.    ? fludrocortisone (FLORINEF) 0.1 MG tablet Take 0.4 mcg by mouth daily as needed (SBP <80). Take 30 meq potassium with each dose    ? fluocinonide (LIDEX) 0.05 % external solution Apply 1 application topically 2 (two) times daily as needed (scalp itching).    ? fluticasone (FLONASE) 50 MCG/ACT nasal spray Place 2 sprays into both nostrils daily as needed for allergies or rhinitis (congestion).    ? hydrocortisone 2.5 % cream Apply 1 application topically 2 (two) times daily as  needed (itching).    ? omeprazole (PRILOSEC) 20 MG capsule Take 20 mg by mouth at bedtime.    ? oxyCODONE-acetaminophen (PERCOCET/ROXICET) 5-325 MG tablet Take 1 tablet by mouth 4 (four) times daily as needed (pain). 15 tablet 0  ? potassium chloride (MICRO-K) 10 MEQ CR capsule Take 30 mEq by mouth daily as needed (with each dose of fludrocortisone).    ? pregabalin (LYRICA) 200 MG capsule Take 200 mg by mouth 2 (two) times daily.    ? sertraline (ZOLOFT) 100 MG tablet Take 100 mg by mouth every morning.    ? Tetrahydrozoline HCl (VISINE OP) Place 1 drop into both eyes daily as needed (dry eyes).    ? traZODone (DESYREL) 100 MG tablet Take 200 mg by mouth at bedtime.    ? vitamin B-12 (CYANOCOBALAMIN) 1000 MCG tablet Take 1,000 mcg by mouth every morning.    ? ?No current facility-administered medications for this visit.  ? ?No results  found. ? ?Review of Systems:   ?A ROS was performed including pertinent positives and negatives as documented in the HPI. ? ? ?Musculoskeletal Exam:   ? ?There were no vitals taken for this visit. ? ?Right lateral incision is well-healed.  He is able to extend at the right knee and perform a straight leg raise although this is somewhat hesitant.  There is some quadriceps atrophy and decreased tone compared to contralateral side.  Range of motion of the right knee is from 0 to 100 degrees without difficulty. ?Imaging:   ? ?X-ray right femur 2 views: ?Status post open reduction internal fixation without evidence of hardware complication with interval callus formation ? ?I personally reviewed and interpreted the radiographs. ? ? ?Assessment:   ?87 year old male who is 6 weeks status post right femoral shaft open reduction internal fixation, x-rays today show increasing callus formation.  As result I would like to progress him to 50% weightbearing.  He may do this for an additional 4 weeks.  I will see him back in 4 weeks we will plan to progress him to full weightbearing at that time ? ?Plan :   ? ?-Return to clinic 4 weeks ?-External PT prescription provided with these restrictions ? ? ? ? ?I personally saw and evaluated the patient, and participated in the management and treatment plan. ? ?Vanetta Mulders, MD ?Attending Physician, Orthopedic Surgery ? ?This document was dictated using Systems analyst. A reasonable attempt at proof reading has been made to minimize errors. ?

## 2021-07-23 ENCOUNTER — Ambulatory Visit (INDEPENDENT_AMBULATORY_CARE_PROVIDER_SITE_OTHER): Payer: No Typology Code available for payment source

## 2021-07-23 ENCOUNTER — Ambulatory Visit (INDEPENDENT_AMBULATORY_CARE_PROVIDER_SITE_OTHER): Payer: No Typology Code available for payment source | Admitting: Orthopaedic Surgery

## 2021-07-23 DIAGNOSIS — S7291XA Unspecified fracture of right femur, initial encounter for closed fracture: Secondary | ICD-10-CM | POA: Diagnosis not present

## 2021-07-23 NOTE — Progress Notes (Signed)
? ?                            ? ? ?Post Operative Evaluation ?  ? ?Procedure/Date of Surgery: Status post right distal femur open reduction internal fixation May 12, 2021 ? ?Interval History:  ? ?Presents today for follow-up of his right femoral shaft open reduction internal fixation.  He has been compliant with 50% weightbearing.  He has a physical therapist coming to his house although he is hoping to have therapy more frequently than this.  Overall he has no complaints.  He has no pain about the femur.  He has been able to progress his weightbearing. ? ?PMH/PSH/Family History/Social History/Meds/Allergies:   ? ?Past Medical History:  ?Diagnosis Date  ? Arthritis   ? COPD (chronic obstructive pulmonary disease) ([place])   ? Hypertension   ? Stroke [facility])   ? ?Past Surgical History:  ?Procedure Laterality Date  ? eyelid surgery Bilateral 11/25/2019  ? INSERT / REPLACE / REMOVE PACEMAKER    ? ORIF FEMUR FRACTURE Right 05/12/2021  ? Procedure: OPEN REDUCTION INTERNAL FIXATION (ORIF) DISTAL FEMUR FRACTURE;  Surgeon: Vanetta Mulders, MD;  Location: [place];  Service: Orthopedics;  Laterality: Right;  ? REPLACEMENT TOTAL KNEE Bilateral   ? TOTAL HIP ARTHROPLASTY Bilateral   ? ?Social History  ? ?Socioeconomic History  ? Marital status: Married  ?  Spouse name: Not on file  ? Number of children: Not on file  ? Years of education: Not on file  ? Highest education level: Not on file  ?Occupational History  ? Not on file  ?Tobacco Use  ? Smoking status: Never  ? Smokeless tobacco: Never  ?Vaping Use  ? Vaping Use: Never used  ?Substance and Sexual Activity  ? Alcohol use: Never  ? Drug use: Never  ? Sexual activity: Not on file  ?Other Topics Concern  ? Not on file  ?Social History Narrative  ? Not on file  ? ?Social Determinants of Health  ? ?Financial Resource Strain: Not on file  ?Food Insecurity: Not on file  ?Transportation Needs: Not on file  ?Physical Activity: Not on file  ?Stress: Not on file  ?Social Connections: Not  on file  ? ?No family history on file. ?No Known Allergies ?Current Outpatient Medications  ?Medication Sig Dispense Refill  ? acetaminophen (TYLENOL) 500 MG tablet Take 1,000 mg by mouth daily as needed for headache.    ? aspirin EC 325 MG tablet Take 325 mg by mouth every morning.    ? atorvastatin (LIPITOR) 20 MG tablet Take 40 mg by mouth at bedtime.    ? Cholecalciferol (VITAMIN D3) 50 MCG (2000 UT) TABS Take 2,000 Units by mouth every morning.    ? docusate sodium (COLACE) 100 MG capsule Take 100 mg by mouth every morning.    ? finasteride (PROSCAR) 5 MG tablet Take 5 mg by mouth at bedtime.    ? flecainide (TAMBOCOR) 100 MG tablet Take 100 mg by mouth 2 (two) times daily.    ? fludrocortisone (FLORINEF) 0.1 MG tablet Take 0.4 mcg by mouth daily as needed (SBP <80). Take 30 meq potassium with each dose    ? fluocinonide (LIDEX) 0.05 % external solution Apply 1 application topically 2 (two) times daily as needed (scalp itching).    ? fluticasone (FLONASE) 50 MCG/ACT nasal spray Place 2 sprays into both nostrils daily as needed for allergies or rhinitis (congestion).    ?  hydrocortisone 2.5 % cream Apply 1 application topically 2 (two) times daily as needed (itching).    ? omeprazole (PRILOSEC) 20 MG capsule Take 20 mg by mouth at bedtime.    ? oxyCODONE-acetaminophen (PERCOCET/ROXICET) 5-325 MG tablet Take 1 tablet by mouth 4 (four) times daily as needed (pain). 15 tablet 0  ? potassium chloride (MICRO-K) 10 MEQ CR capsule Take 30 mEq by mouth daily as needed (with each dose of fludrocortisone).    ? pregabalin (LYRICA) 200 MG capsule Take 200 mg by mouth 2 (two) times daily.    ? sertraline (ZOLOFT) 100 MG tablet Take 100 mg by mouth every morning.    ? Tetrahydrozoline HCl (VISINE OP) Place 1 drop into both eyes daily as needed (dry eyes).    ? traZODone (DESYREL) 100 MG tablet Take 200 mg by mouth at bedtime.    ? vitamin B-12 (CYANOCOBALAMIN) 1000 MCG tablet Take 1,000 mcg by mouth every morning.    ? ?No  current facility-administered medications for this visit.  ? ?No results found. ? ?Review of Systems:   ?A ROS was performed including pertinent positives and negatives as documented in the HPI. ? ? ?Musculoskeletal Exam:   ? ?There were no vitals taken for this visit. ? ?Right lateral incision is well-healed.  He is able to extend at the right knee and perform a straight leg raise well .  There is some quadriceps atrophy and decreased tone compared to contralateral side.  Range of motion of the right knee is from 0 to 120 degrees without difficulty. ?Imaging:   ? ?X-ray right femur 2 views: ?Status post open reduction internal fixation without evidence of hardware complication with interval callus formation ? ?I personally reviewed and interpreted the radiographs. ? ? ?Assessment:   ?87 year old male who is 10 weeks status post right femoral shaft open reduction internal fixation overall doing well.  At this time I will plan to place a referral for a home health agency in order to provide him with additional physical therapy as this will be required as he progresses his weightbearing status ? ?Plan :   ? ?-Return to clinic in 2 months ? ? ? ? ?I personally saw and evaluated the patient, and participated in the management and treatment plan. ? ?Vanetta Mulders, MD ?Attending Physician, Orthopedic Surgery ? ?This document was dictated using Systems analyst. A reasonable attempt at proof reading has been made to minimize errors. ?

## 2021-09-17 ENCOUNTER — Ambulatory Visit (INDEPENDENT_AMBULATORY_CARE_PROVIDER_SITE_OTHER): Payer: Medicare Other | Admitting: Orthopaedic Surgery

## 2021-09-17 ENCOUNTER — Ambulatory Visit (INDEPENDENT_AMBULATORY_CARE_PROVIDER_SITE_OTHER): Payer: No Typology Code available for payment source

## 2021-09-17 DIAGNOSIS — S7291XA Unspecified fracture of right femur, initial encounter for closed fracture: Secondary | ICD-10-CM

## 2021-09-17 NOTE — Progress Notes (Signed)
Post Operative Evaluation    Procedure/Date of Surgery: Status post right distal femur open reduction internal fixation May 12, 2021  Interval History:   Presents today for follow-up of his right femoral shaft open reduction internal fixation.  Overall he is doing very well.  He has begun walking now with a walker.  He states that he feels like he can go without the walker although his family is continuing to encourage this as he does walk quite fast and his follow-up removed without it.  Overall doing very well. PMH/PSH/Family History/Social History/Meds/Allergies:    Past Medical History:  Diagnosis Date   Arthritis    COPD (chronic obstructive pulmonary disease) (HCC)    Hypertension    Stroke [facility])    Past Surgical History:  Procedure Laterality Date   eyelid surgery Bilateral 11/25/2019   INSERT / REPLACE / REMOVE PACEMAKER     ORIF FEMUR FRACTURE Right 05/12/2021   Procedure: OPEN REDUCTION INTERNAL FIXATION (ORIF) DISTAL FEMUR FRACTURE;  Surgeon: Huel Cote, MD;  Location: [facility] OR;  Service: Orthopedics;  Laterality: Right;   REPLACEMENT TOTAL KNEE Bilateral    TOTAL HIP ARTHROPLASTY Bilateral    Social History   Socioeconomic History   Marital status: Married    Spouse name: Not on file   Number of children: Not on file   Years of education: Not on file   Highest education level: Not on file  Occupational History   Not on file  Tobacco Use   Smoking status: Never   Smokeless tobacco: Never  Vaping Use   Vaping Use: Never used  Substance and Sexual Activity   Alcohol use: Never   Drug use: Never   Sexual activity: Not on file  Other Topics Concern   Not on file  Social History Narrative   Not on file   Social Determinants of Health   Financial Resource Strain: Not on file  Food Insecurity: Not on file  Transportation Needs: Not on file  Physical Activity: Not on file  Stress: Not on file  Social Connections: Not on  file   No family history on file. No Known Allergies Current Outpatient Medications  Medication Sig Dispense Refill   acetaminophen (TYLENOL) 500 MG tablet Take 1,000 mg by mouth daily as needed for headache.     aspirin EC 325 MG tablet Take 325 mg by mouth every morning.     atorvastatin (LIPITOR) 20 MG tablet Take 40 mg by mouth at bedtime.     Cholecalciferol (VITAMIN D3) 50 MCG (2000 UT) TABS Take 2,000 Units by mouth every morning.     docusate sodium (COLACE) 100 MG capsule Take 100 mg by mouth every morning.     finasteride (PROSCAR) 5 MG tablet Take 5 mg by mouth at bedtime.     flecainide (TAMBOCOR) 100 MG tablet Take 100 mg by mouth 2 (two) times daily.     fludrocortisone (FLORINEF) 0.1 MG tablet Take 0.4 mcg by mouth daily as needed (SBP <80). Take 30 meq potassium with each dose     fluocinonide (LIDEX) 0.05 % external solution Apply 1 application topically 2 (two) times daily as needed (scalp itching).     fluticasone (FLONASE) 50 MCG/ACT nasal spray Place 2 sprays into both nostrils daily as needed for allergies or rhinitis (congestion).  hydrocortisone 2.5 % cream Apply 1 application topically 2 (two) times daily as needed (itching).     omeprazole (PRILOSEC) 20 MG capsule Take 20 mg by mouth at bedtime.     oxyCODONE-acetaminophen (PERCOCET/ROXICET) 5-325 MG tablet Take 1 tablet by mouth 4 (four) times daily as needed (pain). 15 tablet 0   potassium chloride (MICRO-K) 10 MEQ CR capsule Take 30 mEq by mouth daily as needed (with each dose of fludrocortisone).     pregabalin (LYRICA) 200 MG capsule Take 200 mg by mouth 2 (two) times daily.     sertraline (ZOLOFT) 100 MG tablet Take 100 mg by mouth every morning.     Tetrahydrozoline HCl (VISINE OP) Place 1 drop into both eyes daily as needed (dry eyes).     traZODone (DESYREL) 100 MG tablet Take 200 mg by mouth at bedtime.     vitamin B-12 (CYANOCOBALAMIN) 1000 MCG tablet Take 1,000 mcg by mouth every morning.     No  current facility-administered medications for this visit.   No results found.  Review of Systems:   A ROS was performed including pertinent positives and negatives as documented in the HPI.   Musculoskeletal Exam:    There were no vitals taken for this visit.  Right lateral incision is well-healed.  He is able to extend at the right knee and perform a straight leg raise well .  There is some quadriceps atrophy and decreased tone compared to contralateral side.  Range of motion of the right knee is from 0 to 120 degrees without difficulty. Imaging:    X-ray right femur 2 views: Status post open reduction internal fixation without evidence of hardware complication with interval callus formation  I personally reviewed and interpreted the radiographs.   Assessment:   87 year old male who is 4 months status post right femur open reduction internal fixation overall doing extremely well.  At today's visit I would like to advance him really to no restrictions.  I have also advised him on possibly using [place] crutches as I do believe these will help with his ambulatory status  Plan :    -Return to clinic as needed     I personally saw and evaluated the patient, and participated in the management and treatment plan.  Huel Cote, MD Attending Physician, Orthopedic Surgery  This document was dictated using Dragon voice recognition software. A reasonable attempt at proof reading has been made to minimize errors.

## 2023-01-06 IMAGING — DX DG FEMUR 2+V*R*
4 series · 4 of 4 positions shown · non-contrast
Comparison: June 25, 2021

CLINICAL DATA: Status post ORIF of the right femur

EXAM:
RIGHT FEMUR 2 VIEWS

[femur lat (1 of 2)]
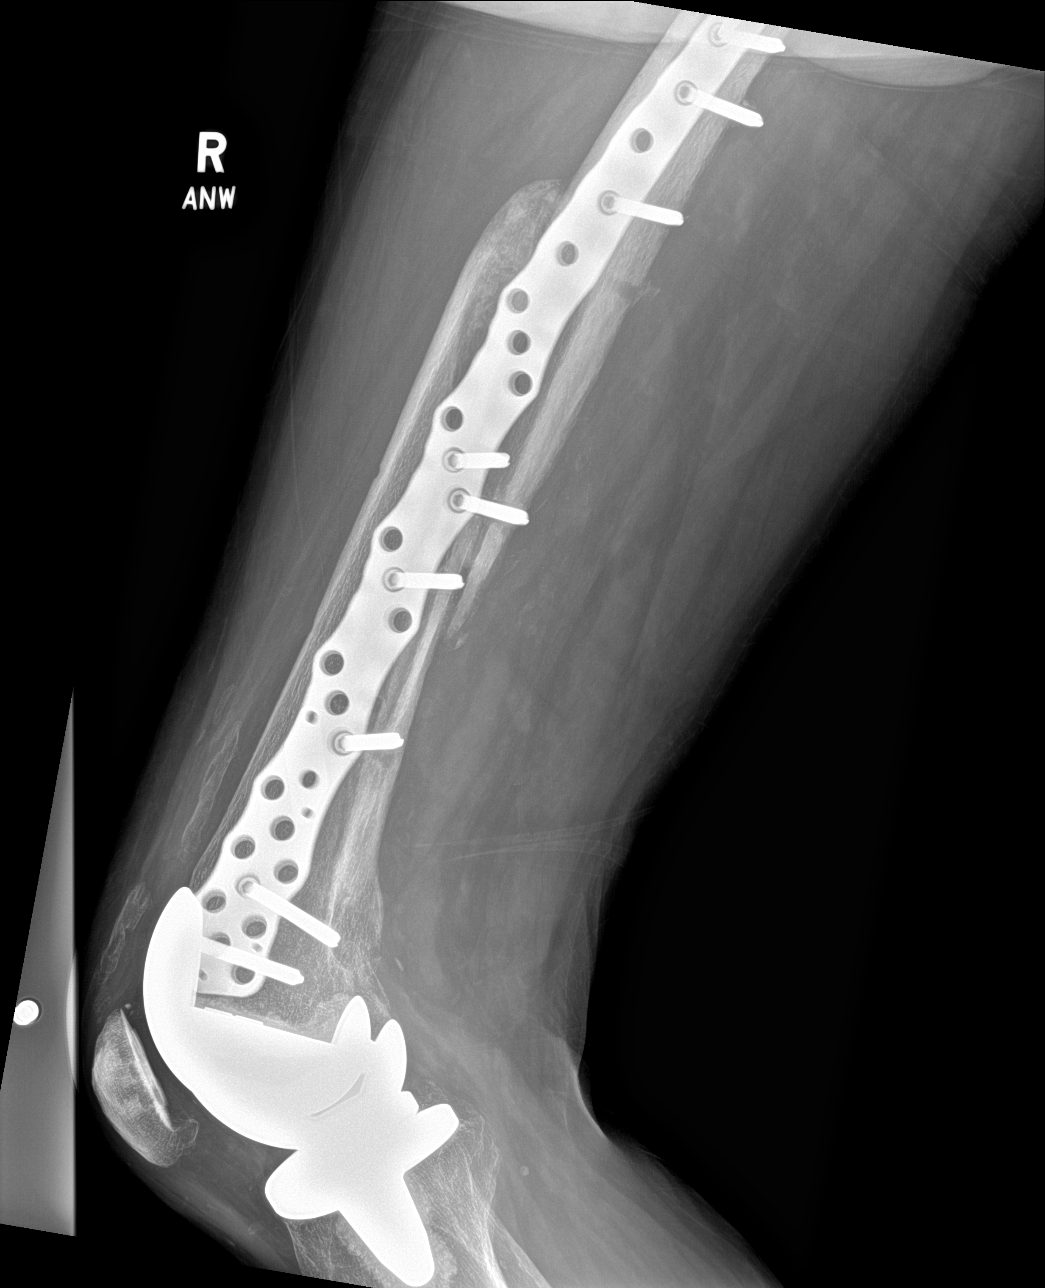

[femur ap (1 of 2)]
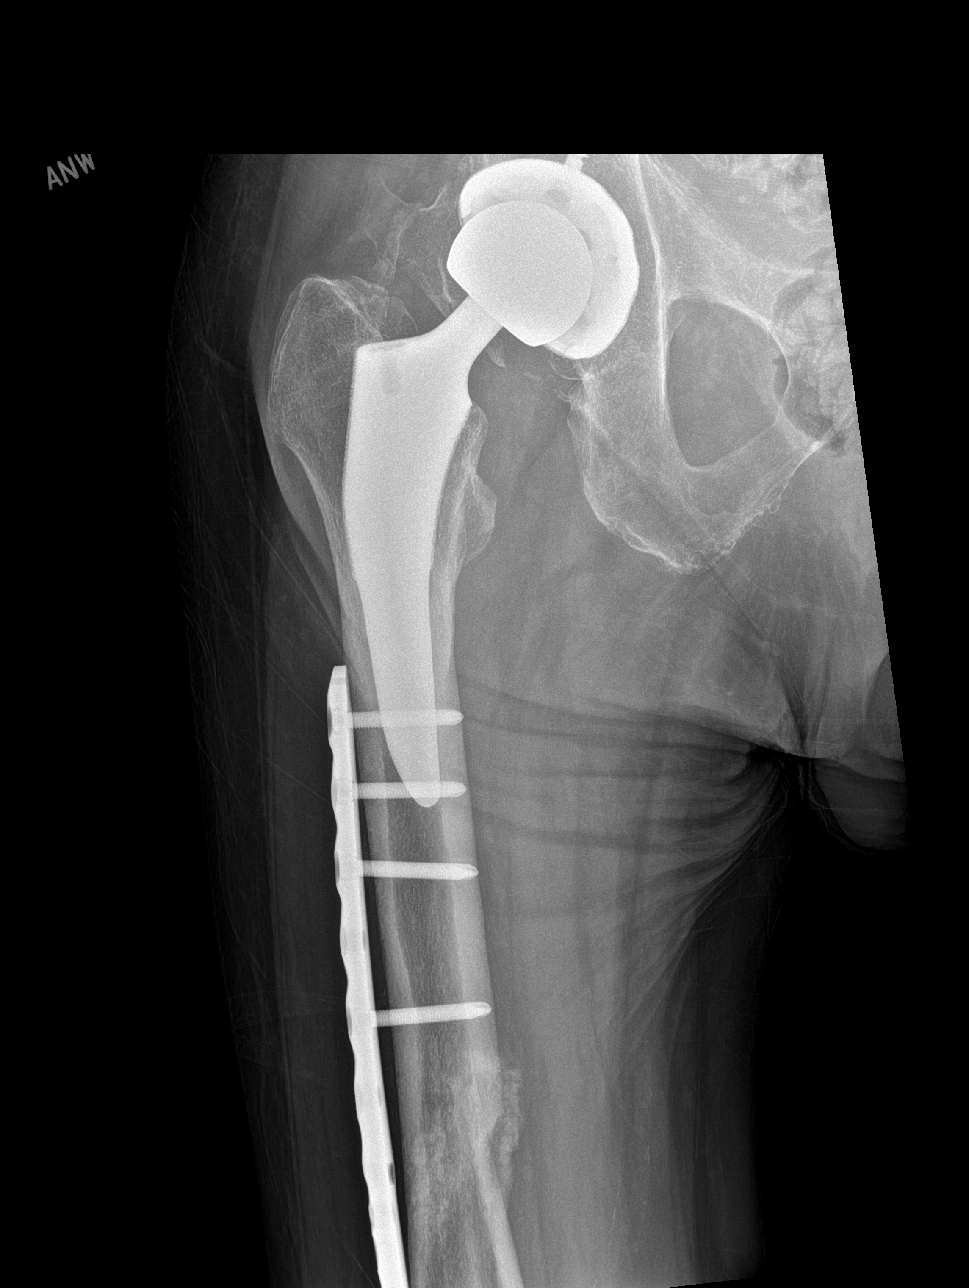

[femur lat (2 of 2)]
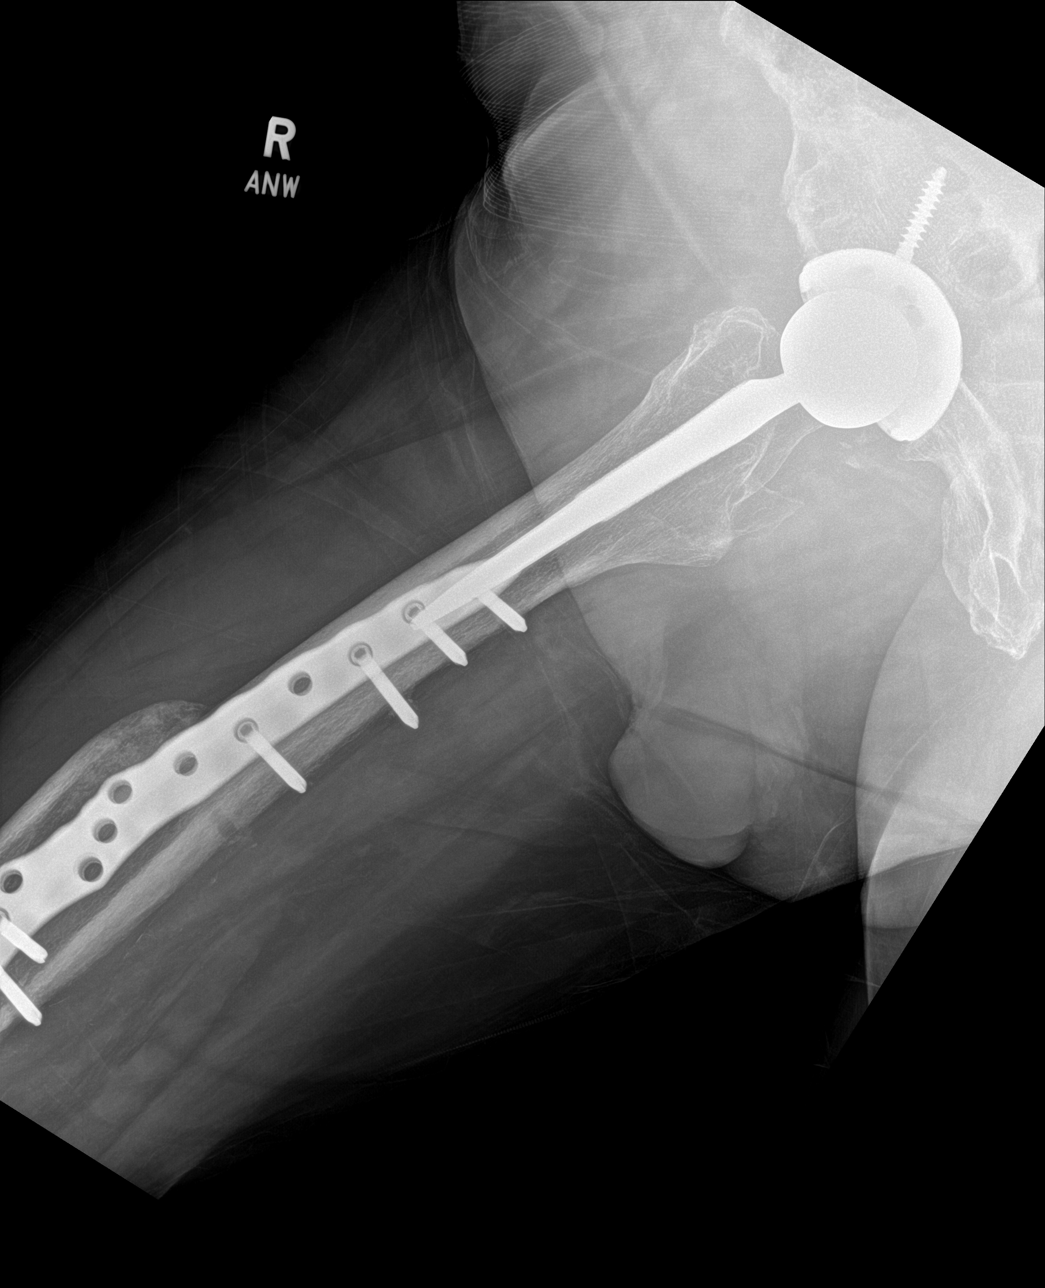

[femur ap (2 of 2)]
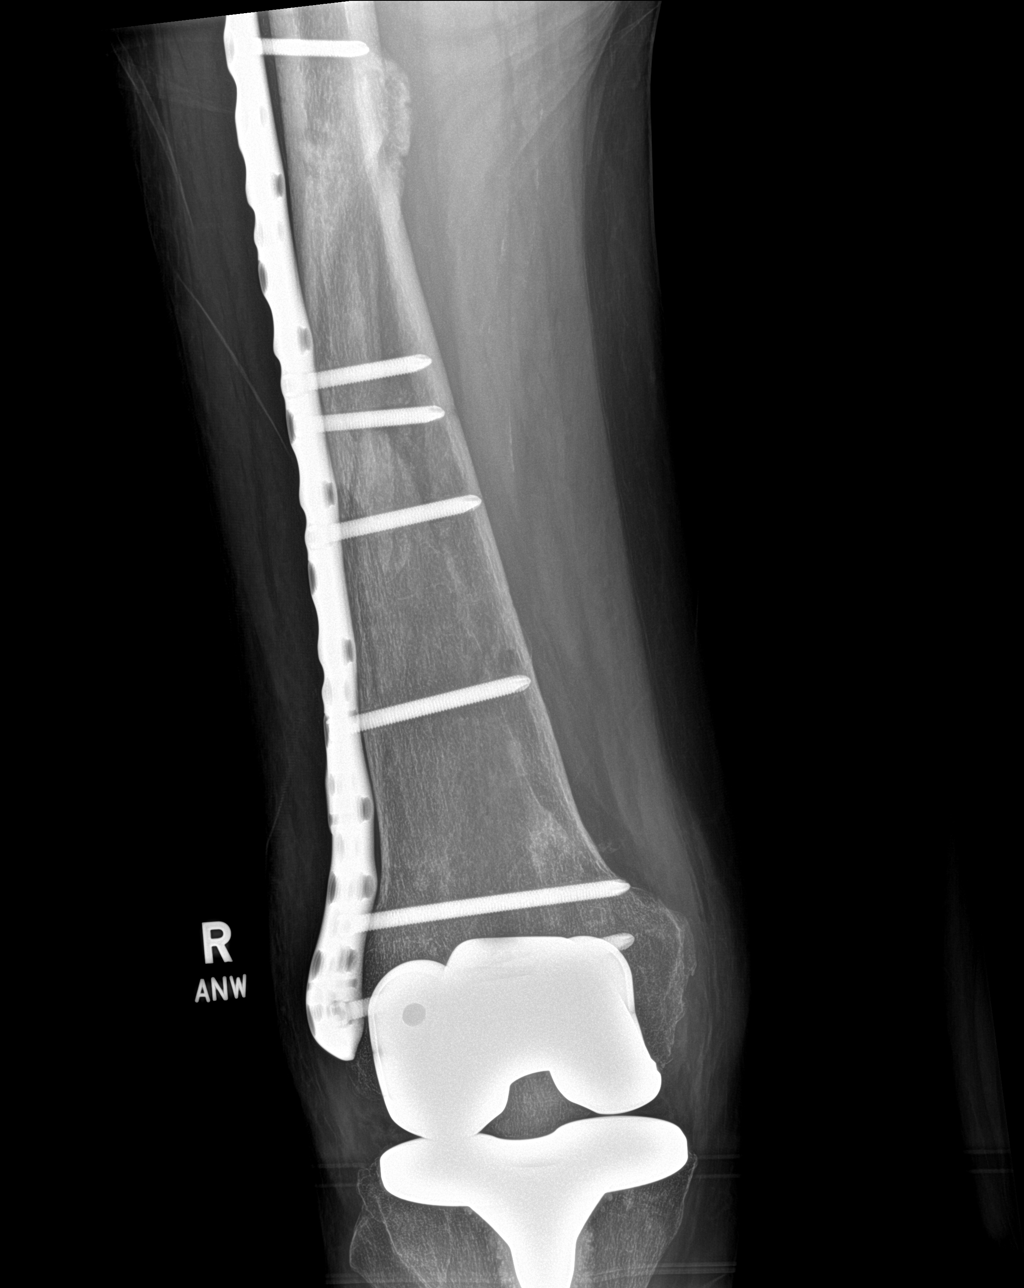

[4 of 4 positions shown; findings below may reference images not displayed]

FINDINGS: A right hip replacement is identified without malalignment and
unchanged compared to prior exam. Fixation plate with horizontal
screws are identified throughout the femur through fracture
deformities of the mid to distal portions of the right femoral shaft
unchanged compared to prior exam. There is evidence of healing at
the fracture sites of the right femur. Right knee replacement is
unchanged.
IMPRESSION: Right hip replacement, prior fixation of femoral shaft, right knee
replacement without malalignment and unchanged compared to prior
exam. There is evidence of healing at the fracture sites of the
right femur.
